# Patient Record
Sex: Male | Born: 1940 | Race: White | Hispanic: No | Marital: Married | Smoking: Current every day smoker
Health system: Southern US, Community
[De-identification: ages and names within clinical notes are randomized; demographics above are authoritative.]

## PROBLEM LIST (undated history)

## (undated) DIAGNOSIS — C787 Secondary malignant neoplasm of liver and intrahepatic bile duct: Secondary | ICD-10-CM

## (undated) DIAGNOSIS — F4310 Post-traumatic stress disorder, unspecified: Secondary | ICD-10-CM

## (undated) DIAGNOSIS — I4891 Unspecified atrial fibrillation: Secondary | ICD-10-CM

## (undated) DIAGNOSIS — I10 Essential (primary) hypertension: Secondary | ICD-10-CM

## (undated) DIAGNOSIS — A048 Other specified bacterial intestinal infections: Secondary | ICD-10-CM

## (undated) HISTORY — DX: Unspecified atrial fibrillation: I48.91

## (undated) HISTORY — DX: Essential (primary) hypertension: I10

## (undated) HISTORY — DX: Other specified bacterial intestinal infections: A04.8

## (undated) HISTORY — PX: OTHER SURGICAL HISTORY: SHX169

## (undated) HISTORY — PX: BLADDER SURGERY: SHX569

---
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
## 2004-06-22 DIAGNOSIS — F4310 Post-traumatic stress disorder, unspecified: Secondary | ICD-10-CM

## 2004-06-22 HISTORY — DX: Post-traumatic stress disorder, unspecified: F43.10

## 2018-11-30 ENCOUNTER — Other Ambulatory Visit: Payer: Self-pay | Admitting: Oncology

## 2019-03-14 ENCOUNTER — Other Ambulatory Visit: Payer: Self-pay

## 2019-04-05 ENCOUNTER — Telehealth: Payer: Self-pay

## 2019-04-05 NOTE — Telephone Encounter (Signed)
Received new referral from Pandora Leiter PA at [facility] for transition of care to [facility]. Patient is a 62YOM who was admitted to hospital on 11/29/18 for abdominal pain. CT scan of abdomen and pelvis revealed multiple liver masses. Liver BX on 11/28/18 which revealed adenocarcinoma of unknown primary. Biopsy was non-confirmatory. Decision was made to not repeat bx but to treat as a GI cancer and to start palliative Folfox. I spoke with patient and he states that he completed 3 cycles of chemo and then underwent Y90 procedure by Dr. Georga Hacking at [facility] week of 03/27/19 .  Patient is in transition to moving to [place] in the next few weeks but does not have a certain date. He reports "things are complicated." Our call was dropped. I have attempted to call back but line is busy. We may have to keep initial consult appointment open until he has a set date for moving to [place].  The referral packet did not include information from care related to Y90 including consult notes. I will ask our new patient scheduler to obtain these records.   I will touch base with patient tomorrow to see if he can give a projected move date.

## 2019-04-07 ENCOUNTER — Telehealth: Payer: Self-pay | Admitting: Oncology

## 2019-04-07 NOTE — Telephone Encounter (Signed)
Received a new patient referral from Dr. Sedonia Small in [place] to schedule Darius Ellis for an oncology appt. He's been cld and scheduled to see Dr. Benay Spice on 11/16 at 2pm. Pt hasn't moved as of yet and wanted to wait about a month before being seen. I updated the GI Navigator. Will also call to request radiology reports from Dr. Azucena Cecil office.

## 2019-04-17 ENCOUNTER — Telehealth: Payer: Self-pay

## 2019-04-17 NOTE — Telephone Encounter (Signed)
Called and spoke with patient's daughter. He has arrived to [place] from [place] earlier than anticipated and she reports that he is having increased symptoms. Patient moved from 11/16 to 11/3 for initial consult with Dr. Benay Spice. I requested that she reach out to [place] provider to obtain documentation from Y90 procedures. She will request that documents be faxed to [facility].

## 2019-04-19 ENCOUNTER — Telehealth: Payer: Self-pay | Admitting: Nurse Practitioner

## 2019-04-19 NOTE — Telephone Encounter (Signed)
Pt's daughter returned my call and confirmed appt w/Lisa on 10/30 at 2:15pm

## 2019-04-20 NOTE — Progress Notes (Addendum)
New Hematology/Oncology Consult   Requesting MD: Dr. Yevonne Aline  Reason for Consult: Metastatic adenocarcinoma of unknown primary  HPI: Darius Ellis is a 78 year old man with metastatic adenocarcinoma of unknown primary.  He initially presented in June of this year with abdominal pain.  He was found to have multiple liver lesions.  Biopsy of a liver lesion showed adenocarcinoma, unknown primary, most likely pancreatobiliary tree, upper GI tract.  He underwent a colonoscopy which was unremarkable except for mild internal hemorrhoids.  Upper endoscopy showed diffuse mild inflammation characterized by congestion and erythema in the gastric antrum.  Biopsy was obtained showing chronic active gastritis with intestinal metaplasia; immunohistochemical staining positive for H pylori.  He reports completing treatment for H. pylori.  Staging chest CT on 12/13/2018 showed resolution of previously noted small bilateral pleural effusions, trace pericardial effusion, interval development of several small lymph nodes in the right anterior cardiophrenic space and mildly prominent lymph nodes in the periceliac region, gastrohepatic ligament and periportal area.  PET scan on 12/28/2018 showed multiple intensely FDG avid hepatic metastases, FDG avid anterior cardiophrenic, periportal, periceliac and gastrohepatic ligament lymph nodes, new trace right pleural effusion and stable trace pericardial effusion.  He began treatment with FOLFOX on 01/10/2019.  He completed cycle 2 on 01/24/2019.  CTs 02/01/2019 showed multiple confluent liver lesions occupying nearly the entire left lobe.  More separated lesions in the right lobe increased in size with at least 10-12 enlarging/new lesions in the right lobe of the liver.  He completed cycle 3 FOLFOX 02/07/2019.  He underwent left hepatic Y90 on 03/03/2019 and right hepatic Y90 on 03/31/2019.   Past Medical History:  Diagnosis Date  . A-fib ([place])   . H. pylori infection    Treated and  healed  . Hypertension   . 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 PTSD (post-traumatic stress disorder) 2006  :   Past Surgical History:  Procedure Laterality Date  . BLADDER SURGERY     Stones  . Left Hip Replacement Left   . radioactive embolization     Liver  :   Current Outpatient Medications:  .  amLODipine-benazepril (LOTREL) 5-10 MG capsule, Take 1 capsule by mouth daily. 5-10 mg daily, Disp: , Rfl:  .  Ascorbic Acid (VITAMIN C) 1000 MG tablet, Take 1,000 mg by mouth daily., Disp: , Rfl:  .  Cholecalciferol (D3 VITAMIN PO), Take by mouth daily. 1000 units daily, Disp: , Rfl:  .  Cyanocobalamin (VITAMIN B 12 PO), Take by mouth daily. 1000 mcg at dinner, Disp: , Rfl:  .  gabapentin (NEURONTIN) 300 MG capsule, Take 300 mg by mouth 2 (two) times daily. 2 capsules 2 times daily, Disp: , Rfl:  .  GLUCOSAMINE-CHONDROITIN PO, Take 1 tablet by mouth 2 (two) times daily. Glucosamine 1500 mg/chondroitin 1200 mg, Disp: , Rfl:  .  Misc Natural Products (LUTEIN 20 PO), Take 1 tablet by mouth daily. 20 mg daily, Disp: , Rfl:  .  Multiple Vitamin (MULTIVITAMIN) tablet, Take 1 tablet by mouth daily., Disp: , Rfl:  .  ondansetron (ZOFRAN) 8 MG tablet, Take by mouth every 8 (eight) hours as needed for nausea or vomiting., Disp: , Rfl:  .  prochlorperazine (COMPAZINE) 10 MG tablet, Take 10 mg by mouth every 6 (six) hours as needed for nausea or vomiting., Disp: , Rfl:  .  topiramate (TOPAMAX) 25 MG tablet, Take 25 mg by mouth at bedtime. 3 tablets at bedtime, Disp: , Rfl: :  :  No Known Allergies:  FH: No family history of malignancy.  SOCIAL HISTORY: He recently relocated to [place] from [place].  He is living with his daughter.  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  Daily pipe.  Occasional glass of wine.  Review of Systems: He reports taking Aleve for abdominal pain, intermittently effective.  Daughter recently noted  abdominal distention.  She reports an ultrasound showed ascites.  There was not enough fluid to remove.  She has noticed pitting lower extremity edema.  He complains of early satiety.  Poor appetite.  No fever.  He has dyspnea which is worse with activity.  This has been ongoing for the past 6 to 8 weeks.  He has an occasional cough.  No fever.  No nausea or vomiting.  Periodic "dry heaves".  Stools are loose.  He does not characterize this as diarrhea.  He reports poor tolerance of FOLFOX chemotherapy with nausea and cold sensitivity.  Physical Exam:  Blood pressure (!) 170/99, pulse 96, temperature 98.3 F (36.8 C), temperature source Temporal, resp. rate 18, height 6\' 1"  (1.854 m), weight 189 lb 11.2 oz (86 kg), SpO2 100 %.  HEENT: Sclera anicteric.  Mild conjunctival pallor. Lungs: Breath sounds diminished at the left lung base.  No respiratory distress. Cardiac: Regular with occasional premature beat. Abdomen: Abdomen distended.  Liver palpable right upper abdomen crossing midline. Vascular: Pitting edema at the lower legs bilaterally left slightly greater than right.  Chronic stasis changes noted. Lymph nodes: No palpable cervical, supraclavicular, axillary or inguinal lymph nodes. Neurologic: Alert and oriented.  Follows commands. Port-A-Cath without erythema.  LABS: none  Assessment and Plan:   1. Metastatic adenocarcinoma unknown primary  CTs 11/28/2018-multiple liver masses  Biopsy liver lesion 11/30/2018 -adenocarcinoma unknown primary most likely pancreatobiliary tree (including intrahepatic cholangiocarcinoma) and upper GI tract; CK7 positive; CK20, TTF-1, CDX2, GATA 3, p63 and PSA negative  Colonoscopy 12/09/2018-internal hemorrhoids, mild.  Examination otherwise normal.  Upper endoscopy 12/09/2018-examined duodenum normal.  Diffuse mild inflammation characterized by congestion and erythema in the gastric antrum.  Biopsy showed chronic active gastritis with intestinal metaplasia;  immunohistochemical staining positive for H pylori.  CT chest 12/13/2018-heterogeneous hypodense nodule left thyroid lobe.  Previously reported small bilateral pleural effusions have resolved.  Trace pericardial effusion.  Interval development of several small lymph nodes in the right anterior cardiophrenic space measuring up to 1.2 x 0.8 cm.  Also mildly prominent lymph nodes in the periceliac region, gastrohepatic ligament and periportal area.  12/13/2018 CEA 8.2   12/28/2018 PET scan-multiple intensely FDG avid hepatic metastases.  FDG avid anterior cardiophrenic, periportal, periceliac and gastrohepatic ligament lymph nodes.  New trace right pleural effusion.  Stable trace pericardial effusion.  Cycle 1 FOLFOX 01/10/2019   Cycle 2 FOLFOX 01/24/2019   01/24/2019 CEA 13.3, CA 19-9 163  CTs 02/01/2019-multiple confluent liver lesions noted occupying nearly the entire left lobe.  More separated lesions in the right lobe have increased in size.  At least 10-12 enlarging/new lesions in the right lobe of the liver.  Cycle 3 FOLFOX 02/07/2019  03/03/2019-left hepatic Y90  03/31/2019-right hepatic Y90 2. BPH 3. Hearing loss 4. Neuropathy 5. History of H. pylori status post treatment 6. History of atrial fibrillation  Darius Ellis is a 78 year old man with metastatic adenocarcinoma of unknown primary.  He recently relocated to [facility] from [place] and is establishing care with medical oncology today.  He has completed 3 cycles of FOLFOX and underwent left hepatic Y90 on 03/03/2019,  right hepatic Y90 on 03/31/2019.  Per his report he did not tolerate FOLFOX well.  We are requesting the liver biopsy material from [place] as well as previous scans on a disc.  He will undergo restaging CTs here next week.  We will also obtain baseline lab studies to include CBC, chemistry panel, CEA and CA 19-9.  He is symptomatic from the cancer with abdominal pain.  We are sending a prescription to his pharmacy for  hydrocodone 5/325 1 to 2 tablets every 6 hours as needed.  He will return for a follow-up visit in 2 weeks.  Patient seen with Dr. Benay Spice.  60 minutes were spent face-to-face at today's visit with the majority of that time involved in counseling/coordination of care.    Ned Card, NP 04/21/2019, 3:44 PM   Darius Ellis was interviewed and examined.  We reviewed records from [place].  He has been diagnosed with metastatic adenocarcinoma of unknown primary.  He has completed treatment with 3 cycles of FOLFOX and liver radioembolization.  He continues to be symptomatic with pain, presumably related to liver metastases.  We discussed the prognosis and treatment options with Darius Ellis and his daughter.  He understands no therapy will be curative.  He will be referred for restaging CTs.  We will obtain the pathology block from the liver biopsy to see if there is enough tissue for NGS testing.  If not we will submit a peripheral blood sample for [employer] testing.  He will begin hydrocodone for pain.  Julieanne Manson, MD

## 2019-04-21 ENCOUNTER — Telehealth: Payer: Self-pay

## 2019-04-21 ENCOUNTER — Other Ambulatory Visit: Payer: Self-pay

## 2019-04-21 ENCOUNTER — Telehealth: Payer: Self-pay | Admitting: Nurse Practitioner

## 2019-04-21 ENCOUNTER — Inpatient Hospital Stay: Payer: Managed Care, Other (non HMO) | Attending: Nurse Practitioner | Admitting: Nurse Practitioner

## 2019-04-21 ENCOUNTER — Encounter: Payer: Self-pay | Admitting: Nurse Practitioner

## 2019-04-21 VITALS — BP 170/99 | HR 96 | Temp 98.3°F | Resp 18 | Ht 73.0 in | Wt 189.7 lb

## 2019-04-21 DIAGNOSIS — C801 Malignant (primary) neoplasm, unspecified: Secondary | ICD-10-CM | POA: Insufficient documentation

## 2019-04-21 DIAGNOSIS — C787 Secondary malignant neoplasm of liver and intrahepatic bile duct: Secondary | ICD-10-CM | POA: Insufficient documentation

## 2019-04-21 DIAGNOSIS — C799 Secondary malignant neoplasm of unspecified site: Secondary | ICD-10-CM

## 2019-04-21 MED ORDER — HYDROCODONE-ACETAMINOPHEN 5-325 MG PO TABS: 1.0000 | ORAL_TABLET | Freq: Four times a day (QID) | ORAL | 0 refills | Status: DC | PRN

## 2019-04-21 NOTE — Progress Notes (Signed)
Met with Darius Ellis and his daughter during initial consult with Lisa/Dr. Benay Spice. We had established contact while he was living in [place]. He is living with his daughter now and is appreciative of the support. I provided them with [facility] resources,list of  virtual support groups and contact information for treatment team members. Will reach out to daughter to ler her know about the support group, [facility], for patient's and caregivers.

## 2019-04-21 NOTE — Telephone Encounter (Signed)
Scheduled per los. Gave avs and calendar. Sent referral

## 2019-04-21 NOTE — Telephone Encounter (Signed)
LPN reached out to [facility] and left a voice mail.  Inquiring about having slides from patient biopsy sent from their pathology dept. To the cancer center.  Patient info left with center as well as cancer center phone number for call back.

## 2019-04-24 ENCOUNTER — Inpatient Hospital Stay: Payer: Managed Care, Other (non HMO) | Attending: Nurse Practitioner

## 2019-04-24 ENCOUNTER — Other Ambulatory Visit: Payer: Self-pay

## 2019-04-24 DIAGNOSIS — J90 Pleural effusion, not elsewhere classified: Secondary | ICD-10-CM | POA: Diagnosis not present

## 2019-04-24 DIAGNOSIS — I4891 Unspecified atrial fibrillation: Secondary | ICD-10-CM | POA: Insufficient documentation

## 2019-04-24 DIAGNOSIS — R188 Other ascites: Secondary | ICD-10-CM | POA: Insufficient documentation

## 2019-04-24 DIAGNOSIS — C787 Secondary malignant neoplasm of liver and intrahepatic bile duct: Secondary | ICD-10-CM | POA: Insufficient documentation

## 2019-04-24 DIAGNOSIS — C801 Malignant (primary) neoplasm, unspecified: Secondary | ICD-10-CM | POA: Insufficient documentation

## 2019-04-24 DIAGNOSIS — N400 Enlarged prostate without lower urinary tract symptoms: Secondary | ICD-10-CM | POA: Diagnosis not present

## 2019-04-24 DIAGNOSIS — G629 Polyneuropathy, unspecified: Secondary | ICD-10-CM | POA: Diagnosis not present

## 2019-04-24 DIAGNOSIS — Z5111 Encounter for antineoplastic chemotherapy: Secondary | ICD-10-CM | POA: Insufficient documentation

## 2019-04-24 DIAGNOSIS — C799 Secondary malignant neoplasm of unspecified site: Secondary | ICD-10-CM

## 2019-04-24 LAB — CBC WITH DIFFERENTIAL (CANCER CENTER ONLY)
Abs Immature Granulocytes: 0.27 10*3/uL — ABNORMAL HIGH (ref 0.00–0.07)
Basophils Absolute: 0.1 10*3/uL (ref 0.0–0.1)
Basophils Relative: 1 %
Eosinophils Absolute: 0.2 10*3/uL (ref 0.0–0.5)
Eosinophils Relative: 1 %
HCT: 41.5 % (ref 39.0–52.0)
Hemoglobin: 13.1 g/dL (ref 13.0–17.0)
Immature Granulocytes: 2 %
Lymphocytes Relative: 7 %
Lymphs Abs: 0.9 10*3/uL (ref 0.7–4.0)
MCH: 29.9 pg (ref 26.0–34.0)
MCHC: 31.6 g/dL (ref 30.0–36.0)
MCV: 94.7 fL (ref 80.0–100.0)
Monocytes Absolute: 1.2 10*3/uL — ABNORMAL HIGH (ref 0.1–1.0)
Monocytes Relative: 10 %
Neutro Abs: 9.4 10*3/uL — ABNORMAL HIGH (ref 1.7–7.7)
Neutrophils Relative %: 79 %
Platelet Count: 216 10*3/uL (ref 150–400)
RBC: 4.38 MIL/uL (ref 4.22–5.81)
RDW: 14.6 % (ref 11.5–15.5)
WBC Count: 11.9 10*3/uL — ABNORMAL HIGH (ref 4.0–10.5)
nRBC: 0 % (ref 0.0–0.2)

## 2019-04-24 LAB — URINALYSIS, COMPLETE (UACMP) WITH MICROSCOPIC
Bacteria, UA: NONE SEEN
Bilirubin Urine: NEGATIVE
Glucose, UA: NEGATIVE mg/dL
Hgb urine dipstick: NEGATIVE
Ketones, ur: NEGATIVE mg/dL
Leukocytes,Ua: NEGATIVE
Nitrite: NEGATIVE
Protein, ur: NEGATIVE mg/dL
Specific Gravity, Urine: 1.024 (ref 1.005–1.030)
pH: 5 (ref 5.0–8.0)

## 2019-04-24 LAB — CMP (CANCER CENTER ONLY)
ALT: 14 U/L (ref 0–44)
AST: 31 U/L (ref 15–41)
Albumin: 2.8 g/dL — ABNORMAL LOW (ref 3.5–5.0)
Alkaline Phosphatase: 238 U/L — ABNORMAL HIGH (ref 38–126)
Anion gap: 11 (ref 5–15)
BUN: 25 mg/dL — ABNORMAL HIGH (ref 8–23)
CO2: 23 mmol/L (ref 22–32)
Calcium: 8.9 mg/dL (ref 8.9–10.3)
Chloride: 107 mmol/L (ref 98–111)
Creatinine: 1.29 mg/dL — ABNORMAL HIGH (ref 0.61–1.24)
GFR, Est AFR Am: >60 mL/min (ref >60)
GFR, Estimated: 52 mL/min — ABNORMAL LOW (ref >60)
Glucose, Bld: 103 mg/dL — ABNORMAL HIGH (ref 70–99)
Potassium: 4 mmol/L (ref 3.5–5.1)
Sodium: 141 mmol/L (ref 135–145)
Total Bilirubin: 1 mg/dL (ref 0.3–1.2)
Total Protein: 6.3 g/dL — ABNORMAL LOW (ref 6.5–8.1)

## 2019-04-24 LAB — CEA (IN HOUSE-CHCC): CEA (CHCC-In House): 25.34 ng/mL — ABNORMAL HIGH (ref 0.00–5.00)

## 2019-04-25 ENCOUNTER — Ambulatory Visit: Payer: Self-pay | Admitting: Nurse Practitioner

## 2019-04-25 LAB — CANCER ANTIGEN 19-9: CA 19-9: 131 U/mL — ABNORMAL HIGH (ref 0–35)

## 2019-04-27 ENCOUNTER — Other Ambulatory Visit: Payer: Self-pay | Admitting: Nurse Practitioner

## 2019-04-27 DIAGNOSIS — C787 Secondary malignant neoplasm of liver and intrahepatic bile duct: Secondary | ICD-10-CM

## 2019-04-27 DIAGNOSIS — C799 Secondary malignant neoplasm of unspecified site: Secondary | ICD-10-CM

## 2019-04-27 DIAGNOSIS — C801 Malignant (primary) neoplasm, unspecified: Secondary | ICD-10-CM

## 2019-05-01 ENCOUNTER — Ambulatory Visit (INDEPENDENT_AMBULATORY_CARE_PROVIDER_SITE_OTHER): Payer: Managed Care, Other (non HMO) | Admitting: Family Medicine

## 2019-05-01 ENCOUNTER — Encounter: Payer: Self-pay | Admitting: Family Medicine

## 2019-05-01 ENCOUNTER — Other Ambulatory Visit: Payer: Self-pay

## 2019-05-01 VITALS — BP 130/76 | HR 100 | Ht 73.0 in | Wt 195.0 lb

## 2019-05-01 DIAGNOSIS — R0600 Dyspnea, unspecified: Secondary | ICD-10-CM

## 2019-05-01 DIAGNOSIS — I499 Cardiac arrhythmia, unspecified: Secondary | ICD-10-CM | POA: Diagnosis not present

## 2019-05-01 DIAGNOSIS — R0609 Other forms of dyspnea: Secondary | ICD-10-CM

## 2019-05-01 DIAGNOSIS — R0981 Nasal congestion: Secondary | ICD-10-CM

## 2019-05-01 MED ORDER — PREDNISONE 20 MG PO TABS: 20.0000 mg | ORAL_TABLET | Freq: Two times a day (BID) | ORAL | 0 refills | Status: AC

## 2019-05-01 MED ORDER — FLUTICASONE PROPIONATE 50 MCG/ACT NA SUSP: 2.0000 | Freq: Every day | NASAL | 6 refills | Status: AC

## 2019-05-01 NOTE — Progress Notes (Addendum)
Established Patient Office Visit  Subjective:  Patient ID: Darius Ellis, male    DOB: 1941-05-27  Age: 78 y.o. MRN: [id number]  CC:  Chief Complaint  Patient presents with  . Establish Care    HPI Darius Ellis presents for establishment of care.  Accompanied by his daughter who is a Marine scientist at the [facility] wound care center.  Patient recently moved up here from [facility].  Diagnosed earlier this year with metastatic adenocarcinoma of his liver without a known primary.  Recent follow-up with oncology.  Pending CT scans of his abdomen and pelvis have been ordered.  He has been having problems with nasal and sinus congestion over the last several months.  He does not have a history of sinus disease or allergy rhinitis.  Physician had suggested Claritin which has not helped.  He has not been using nasal decongestants.  Denies fevers rhinorrhea or significant postnasal drip.  Continues to smoke a pipe 50 twice daily.  Significant past medical history of hypertension, paroxysmal atrial fibrillation BPH (taking finasteride and Flomax) neuropathy.  He has ongoing issues with some abdominal pain and loose stool.  Rarely has been taking his narcotic pain medicine.  He has developed shortness of breath and dyspnea with exertion.  There has been no chest pain.  Past Medical History:  Diagnosis Date  . A-fib ([place])   . H. pylori infection    Treated and healed  . Hypertension   . 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 PTSD (post-traumatic stress disorder) 2006    Past Surgical History:  Procedure Laterality Date  . BLADDER SURGERY     Stones  . Left Hip Replacement Left   . radioactive embolization     Liver    Family History  Problem Relation Age of Onset  . Polycystic kidney disease Mother     Social History   Socioeconomic History  . Marital status: Married    Spouse name: Not on file  . Number of children: Not on file  . Years of  education: Not on file  . Highest education level: Not on file  Occupational History  . Not on file  Social Needs  . Financial resource strain: Not on file  . Food insecurity    Worry: Not on file    Inability: Not on file  . Transportation needs    Medical: Not on file    Non-medical: Not on file  Tobacco Use  . Smoking status: Current Every Day Smoker    Types: Pipe  . Smokeless tobacco: Current User  Substance and Sexual Activity  . Alcohol use: Not Currently  . Drug use: Never  . Sexual activity: Not on file  Lifestyle  . Physical activity    Days per week: Not on file    Minutes per session: Not on file  . Stress: Not on file  Relationships  . Social Herbalist on phone: Not on file    Gets together: Not on file    Attends religious service: Not on file    Active member of club or organization: Not on file    Attends meetings of clubs or organizations: Not on file    Relationship status: Not on file  . Intimate partner violence    Fear of current or ex partner: Not on file    Emotionally abused: Not on file    Physically abused: Not on file    Forced  sexual activity: Not on file  Other Topics Concern  . Not on file  Social History Narrative  . Not on file    Outpatient Medications Prior to Visit  Medication Sig Dispense Refill  . amLODipine-benazepril (LOTREL) 5-10 MG capsule Take 1 capsule by mouth daily. 5-10 mg daily    . Ascorbic Acid (VITAMIN C) 1000 MG tablet Take 1,000 mg by mouth daily.    . Cholecalciferol (D3 VITAMIN PO) Take by mouth daily. 1000 units daily    . Cyanocobalamin (VITAMIN B 12 PO) Take by mouth daily. 1000 mcg at dinner    . gabapentin (NEURONTIN) 300 MG capsule Take 300 mg by mouth 2 (two) times daily. 2 capsules 2 times daily    . GLUCOSAMINE-CHONDROITIN PO Take 1 tablet by mouth 2 (two) times daily. Glucosamine 1500 mg/chondroitin 1200 mg    . HYDROcodone-acetaminophen (NORCO) 5-325 MG tablet Take 1-2 tablets by mouth  every 6 (six) hours as needed for moderate pain. 40 tablet 0  . Misc Natural Products (LUTEIN 20 PO) Take 1 tablet by mouth daily. 20 mg daily    . Multiple Vitamin (MULTIVITAMIN) tablet Take 1 tablet by mouth daily.    . ondansetron (ZOFRAN) 8 MG tablet Take by mouth every 8 (eight) hours as needed for nausea or vomiting.    . prochlorperazine (COMPAZINE) 10 MG tablet Take 10 mg by mouth every 6 (six) hours as needed for nausea or vomiting.    . topiramate (TOPAMAX) 25 MG tablet Take 25 mg by mouth at bedtime. 3 tablets at bedtime     No facility-administered medications prior to visit.     No Known Allergies  ROS Review of Systems  Constitutional: Negative for diaphoresis, fatigue, fever and unexpected weight change.  HENT: Positive for hearing loss and sinus pressure. Negative for postnasal drip, rhinorrhea and sneezing.   Eyes: Negative for photophobia and visual disturbance.  Respiratory: Positive for shortness of breath. Negative for chest tightness and wheezing.   Cardiovascular: Positive for leg swelling. Negative for chest pain and palpitations.  Gastrointestinal: Positive for abdominal pain and nausea. Negative for abdominal distention, constipation and vomiting.  Endocrine: Negative for polyphagia and polyuria.  Genitourinary: Negative for difficulty urinating, frequency and urgency.  Musculoskeletal: Negative for back pain and gait problem.  Skin: Negative for pallor and rash.  Allergic/Immunologic: Negative for immunocompromised state.  Neurological: Negative for light-headedness and numbness.  Hematological: Does not bruise/bleed easily.  Psychiatric/Behavioral: Negative.       Objective:    Physical Exam  Constitutional: He is oriented to person, place, and time. He appears well-developed and well-nourished. No distress.  HENT:  Head: Normocephalic and atraumatic.  Right Ear: External ear normal.  Left Ear: External ear normal.  Mouth/Throat: Oropharynx is clear  and moist. No oropharyngeal exudate.  Eyes: Pupils are equal, round, and reactive to light. Conjunctivae are normal. Right eye exhibits no discharge. Left eye exhibits no discharge. No scleral icterus.  Neck: No JVD present. No tracheal deviation present. No thyromegaly present.  Cardiovascular: Normal rate, regular rhythm and normal heart sounds.  Dropped beats   Pulmonary/Chest: Effort normal. No stridor. He has decreased breath sounds. He has no wheezes. He has no rhonchi. He has no rales.  Abdominal: Bowel sounds are normal.  Musculoskeletal:        General: Edema present.  Lymphadenopathy:    He has no cervical adenopathy.  Neurological: He is alert and oriented to person, place, and time.  Skin: Skin is warm  and dry. He is not diaphoretic.  Psychiatric: He has a normal mood and affect. His behavior is normal.    BP 130/76   Pulse 100   Ht 6\' 1"  (1.854 m)   Wt 195 lb (88.5 kg)   SpO2 98%   BMI 25.73 kg/m  Wt Readings from Last 3 Encounters:  05/01/19 195 lb (88.5 kg)  04/21/19 189 lb 11.2 oz (86 kg)   BP Readings from Last 3 Encounters:  05/01/19 130/76  04/21/19 (!) 170/99   Guideline developer:  UpToDate (see UpToDate for funding source) Date Released: June 2014  Health Maintenance Due  Topic Date Due  . Samul Dada  12/[birth year]/1958  . PNA vac Low Risk Adult (1 of 2 - PCV13) 12/30/2004  . INFLUENZA VACCINE  01/21/2019    There are no preventive care reminders to display for this patient.  No results found for: TSH Lab Results  Component Value Date   WBC 11.9 (H) 04/24/2019   HGB 13.1 04/24/2019   HCT 41.5 04/24/2019   MCV 94.7 04/24/2019   PLT 216 04/24/2019   Lab Results  Component Value Date   NA 141 04/24/2019   K 4.0 04/24/2019   CO2 23 04/24/2019   GLUCOSE 103 (H) 04/24/2019   BUN 25 (H) 04/24/2019   CREATININE 1.29 (H) 04/24/2019   BILITOT 1.0 04/24/2019   ALKPHOS 238 (H) 04/24/2019   AST 31 04/24/2019   ALT 14 04/24/2019   PROT 6.3 (L)  04/24/2019   ALBUMIN 2.8 (L) 04/24/2019   CALCIUM 8.9 04/24/2019   ANIONGAP 11 04/24/2019   No results found for: CHOL No results found for: HDL No results found for: LDLCALC No results found for: TRIG No results found for: CHOLHDL No results found for: HGBA1C    Assessment & Plan:   Problem List Items Addressed This Visit    None    Visit Diagnoses    Irregular heartbeat    -  Primary   Relevant Orders   EKG 12-Lead (Completed)   DOE (dyspnea on exertion)       Relevant Medications   predniSONE (DELTASONE) 20 MG tablet   Sinus congestion       Relevant Medications   predniSONE (DELTASONE) 20 MG tablet   fluticasone (FLONASE) 50 MCG/ACT nasal spray      Meds ordered this encounter  Medications  . predniSONE (DELTASONE) 20 MG tablet    Sig: Take 1 tablet (20 mg total) by mouth 2 (two) times daily with a meal for 7 days.    Dispense:  14 tablet    Refill:  0  . fluticasone (FLONASE) 50 MCG/ACT nasal spray    Sig: Place 2 sprays into both nostrils daily.    Dispense:  16 g    Refill:  6    Follow-up: Return in about 2 weeks (around 05/15/2019).   Trial of Flonase and prednisone.  Follow-up in 2 weeks for recheck.

## 2019-05-02 ENCOUNTER — Ambulatory Visit (HOSPITAL_COMMUNITY)
Admission: RE | Admit: 2019-05-02 | Discharge: 2019-05-02 | Disposition: A | Discharge status: Home / Self Care | Payer: Managed Care, Other (non HMO) | Source: Ambulatory Visit | Attending: Nurse Practitioner | Admitting: Nurse Practitioner

## 2019-05-02 ENCOUNTER — Ambulatory Visit
Admission: RE | Admit: 2019-05-02 | Discharge: 2019-05-02 | Disposition: A | Discharge status: Home / Self Care | Payer: Self-pay | Source: Ambulatory Visit | Attending: Oncology | Admitting: Oncology

## 2019-05-02 ENCOUNTER — Other Ambulatory Visit: Payer: Self-pay | Admitting: *Deleted

## 2019-05-02 DIAGNOSIS — C787 Secondary malignant neoplasm of liver and intrahepatic bile duct: Secondary | ICD-10-CM

## 2019-05-02 DIAGNOSIS — C801 Malignant (primary) neoplasm, unspecified: Secondary | ICD-10-CM | POA: Diagnosis present

## 2019-05-02 MED ORDER — SODIUM CHLORIDE (PF) 0.9 % IJ SOLN
INTRAMUSCULAR | Status: AC
  Filled 2019-05-02: qty 50

## 2019-05-02 MED ORDER — IOHEXOL 300 MG/ML  SOLN
100.0000 mL | Freq: Once | INTRAMUSCULAR | Status: AC | PRN
  Administered 2019-05-02: 17:00:00 100 mL via INTRAVENOUS

## 2019-05-02 NOTE — Progress Notes (Signed)
Received CT report and CD of abd/pelvis CT from [facility]. CD taken to radiology to load.

## 2019-05-03 ENCOUNTER — Other Ambulatory Visit (HOSPITAL_COMMUNITY): Payer: Self-pay | Admitting: Oncology

## 2019-05-03 ENCOUNTER — Inpatient Hospital Stay
Admission: RE | Admit: 2019-05-03 | Discharge: 2019-05-03 | Disposition: A | Discharge status: Home / Self Care | Payer: Self-pay | Source: Ambulatory Visit | Attending: Oncology | Admitting: Oncology

## 2019-05-03 DIAGNOSIS — C801 Malignant (primary) neoplasm, unspecified: Secondary | ICD-10-CM

## 2019-05-03 NOTE — Telephone Encounter (Signed)
Opened in error

## 2019-05-05 ENCOUNTER — Encounter: Payer: Self-pay | Admitting: Nurse Practitioner

## 2019-05-05 ENCOUNTER — Ambulatory Visit (HOSPITAL_COMMUNITY)
Admission: RE | Admit: 2019-05-05 | Discharge: 2019-05-05 | Disposition: A | Discharge status: Home / Self Care | Payer: Medicare Other | Source: Ambulatory Visit | Attending: Nurse Practitioner | Admitting: Nurse Practitioner

## 2019-05-05 ENCOUNTER — Inpatient Hospital Stay: Payer: Managed Care, Other (non HMO) | Admitting: Nurse Practitioner

## 2019-05-05 ENCOUNTER — Other Ambulatory Visit: Payer: Self-pay

## 2019-05-05 VITALS — BP 125/83 | HR 97 | Temp 97.9°F | Resp 18 | Ht 73.0 in | Wt 196.8 lb

## 2019-05-05 DIAGNOSIS — C801 Malignant (primary) neoplasm, unspecified: Secondary | ICD-10-CM | POA: Diagnosis present

## 2019-05-05 DIAGNOSIS — C787 Secondary malignant neoplasm of liver and intrahepatic bile duct: Secondary | ICD-10-CM

## 2019-05-05 DIAGNOSIS — Z7189 Other specified counseling: Secondary | ICD-10-CM | POA: Diagnosis not present

## 2019-05-05 DIAGNOSIS — Z5111 Encounter for antineoplastic chemotherapy: Secondary | ICD-10-CM | POA: Diagnosis not present

## 2019-05-05 HISTORY — DX: Secondary malignant neoplasm of liver and intrahepatic bile duct: C78.7

## 2019-05-05 HISTORY — DX: Secondary malignant neoplasm of liver and intrahepatic bile duct: C80.1

## 2019-05-05 LAB — SURGICAL PATHOLOGY

## 2019-05-05 MED ORDER — LIDOCAINE HCL 1 % IJ SOLN
INTRAMUSCULAR | Status: AC
  Filled 2019-05-05: qty 20

## 2019-05-05 NOTE — Procedures (Signed)
Ultrasound-guided diagnostic and therapeutic paracentesis performed yielding 5 liters (maximum ordered)of yellow fluid. No immediate complications. A portion of the fluid was sent to the lab for preordered studies. EBL none.   

## 2019-05-05 NOTE — Progress Notes (Addendum)
[facility] OFFICE PROGRESS NOTE   Diagnosis: Metastatic adenocarcinoma unknown primary  INTERVAL HISTORY:   Darius Ellis returns as scheduled.  He reports increased shortness of breath.  No cough or fever.  He denies significant pain.  He has occasional left-sided abdominal pain.  Bowels are moving.  He has diarrhea at times.  Appetite is poor.  Objective:  Vital signs in last 24 hours:  Blood pressure 125/83, pulse 97, temperature 97.9 F (36.6 C), temperature source Temporal, resp. rate 18, height 6\' 1"  (1.854 m), weight 196 lb 12.8 oz (89.3 kg), SpO2 100 %.    Resp: Breath sounds diminished at the bases bilaterally.  No respiratory distress. Cardio: Regular rate and rhythm. GI: Abdomen is distended.  Prominent vein pattern over abdominal wall. Vascular: Pitting edema at the lower legs bilaterally left greater than right.  Chronic stasis changes. Neuro: Alert and oriented.  Port-A-Cath without erythema.  Lab Results:  Lab Results  Component Value Date   WBC 11.9 (H) 04/24/2019   HGB 13.1 04/24/2019   HCT 41.5 04/24/2019   MCV 94.7 04/24/2019   PLT 216 04/24/2019   NEUTROABS 9.4 (H) 04/24/2019    Imaging:  No results found.  Medications: I have reviewed the patient's current medications.  Assessment/Plan: 1. Metastatic adenocarcinoma unknown primary  CTs 11/28/2018-large mass left hepatic lobe measuring 9.1 cm, few ill-defined lesions in the right hepatic lobe measuring up to 9 mm.  Indeterminate gastrohepatic ligament lymph node measuring 1.1 cm.  Shotty periportal and right pericardial adenopathy.  Biopsy liver lesion 11/30/2018 -adenocarcinoma unknown primary most likely pancreatobiliary tree (including intrahepatic cholangiocarcinoma) and upper GI tract; CK7 positive; CK20, TTF-1, CDX2, GATA 3, p63 and PSA negative  Colonoscopy 12/09/2018-internal hemorrhoids, mild.  Examination otherwise normal.  Upper endoscopy 12/09/2018-examined duodenum  normal.  Diffuse mild inflammation characterized by congestion and erythema in the gastric antrum.  Biopsy showed chronic active gastritis with intestinal metaplasia; immunohistochemical staining positive for H pylori.  CT chest 12/13/2018-heterogeneous hypodense nodule left thyroid lobe.  Previously reported small bilateral pleural effusions have resolved.  Trace pericardial effusion.  Interval development of several small lymph nodes in the right anterior cardiophrenic space measuring up to 1.2 x 0.8 cm.  Also mildly prominent lymph nodes in the periceliac region, gastrohepatic ligament and periportal area.  12/13/2018 CEA 8.2   12/28/2018 PET scan-multiple intensely FDG avid hepatic metastases.  FDG avid anterior cardiophrenic, periportal, periceliac and gastrohepatic ligament lymph nodes.  New trace right pleural effusion.  Stable trace pericardial effusion.  Cycle 1 FOLFOX 01/10/2019   Cycle 2 FOLFOX 01/24/2019   01/24/2019 CEA 13.3, CA 19-9 163  CTs 02/01/2019-multiple confluent liver lesions noted occupying nearly the entire left lobe.  More separated lesions in the right lobe have increased in size.  At least 10-12 enlarging/new lesions in the right lobe of the liver.  Cycle 3 FOLFOX 02/07/2019  03/03/2019-left hepatic Y90  03/31/2019-right hepatic Y90  CTs 05/02/2019-liver lesions have significantly increased in number; others are stable to increased in size since 02/01/2019 CT.  Enlarged gastrohepatic ligament, portacaval, left periaortic and aortocaval lymph nodes appear stable.  Anasarca, ascites and pleural effusions are essentially new. 2. BPH 3. Hearing loss 4. Neuropathy 5. History of H. pylori status post treatment 6. History of atrial fibrillation  Disposition: Darius Ellis appears unchanged.  He continues to have a poor performance status.  The recent restaging CTs show new liver lesions as well as new ascites and pleural effusions.  Other liver lesions are  stable to increased.  We  reviewed the CT report and images with Darius Ellis and his daughter.  They understand findings are consistent with progression of the cancer.  We discussed options to include a trial of chemotherapy versus supportive care.  He is interested in chemotherapy.  Dr. Benay Spice recommends gemcitabine/Abraxane on a 2-week schedule.  We reviewed potential toxicities including bone marrow toxicity, nausea, hair loss, allergic reaction.  We discussed the possibility of fever and rash with gemcitabine.  We discussed neuropathy associated with Abraxane.  He agrees to proceed.  He will attend a chemotherapy education class with the plan for cycle 1 gemcitabine/Abraxane in 1 week.  We discussed submitting peripheral blood for guardant 360 testing.  He would like to proceed with this.    He is uncomfortable from abdominal distention/ascites.  We are referring him for a diagnostic/therapeutic paracentesis.  He will return for lab, follow-up, cycle 1 gemcitabine/Abraxane in 1 week.  He will contact the office in the interim with any problems.  Patient seen with Dr. Benay Spice.  25 minutes were spent face-to-face at today's visit with the majority of that time involved in counseling/coordination of care.    Ned Card ANP/GNP-BC   05/05/2019  9:37 AM  This was a shared visit with Ned Card.  We reviewed the CT images with Darius Ellis and his daughter.  There is clinical and radiologic evidence of progression.  He is symptomatic with large volume ascites.  He will be referred for a therapeutic paracentesis.  Darius Ellis understands no therapy will be curative.  We discussed the poor prognosis and treatment options.  We discussed comfort care versus a trial of second line systemic therapy.  He would like to proceed with additional therapy.  I recommend gemcitabine/Abraxane.  We reviewed potential toxicities associated with this regimen.  He agrees to proceed.  We will submit tumor from the June 2020 liver biopsy or  peripheral blood for NGS testing.  Julieanne Manson, MD

## 2019-05-05 NOTE — Progress Notes (Signed)
START OFF PATHWAY REGIMEN - Other   [id number]:Nab-Paclitaxel (Abraxane) 125 mg/m2 D1, 8, 15 + Gemcitabine 1,000 mg/m2 D1, 8, 15 q28 Days:   A cycle is every 28 days:     Nab-paclitaxel (protein bound)      Gemcitabine   **Always confirm dose/schedule in your pharmacy ordering system**  Patient Characteristics: Intent of Therapy: Non-Curative / Palliative Intent, Discussed with Patient 

## 2019-05-08 ENCOUNTER — Ambulatory Visit: Payer: Self-pay | Admitting: Oncology

## 2019-05-08 ENCOUNTER — Telehealth: Payer: Self-pay | Admitting: Oncology

## 2019-05-08 LAB — CYTOLOGY - NON PAP

## 2019-05-08 NOTE — Telephone Encounter (Signed)
Scheduled per los. Called and left msg  ° °

## 2019-05-10 ENCOUNTER — Other Ambulatory Visit: Payer: Self-pay | Admitting: *Deleted

## 2019-05-10 ENCOUNTER — Other Ambulatory Visit: Payer: Self-pay

## 2019-05-10 ENCOUNTER — Inpatient Hospital Stay: Payer: Managed Care, Other (non HMO)

## 2019-05-10 DIAGNOSIS — C787 Secondary malignant neoplasm of liver and intrahepatic bile duct: Secondary | ICD-10-CM

## 2019-05-10 DIAGNOSIS — C801 Malignant (primary) neoplasm, unspecified: Secondary | ICD-10-CM

## 2019-05-12 ENCOUNTER — Inpatient Hospital Stay: Payer: Managed Care, Other (non HMO)

## 2019-05-12 ENCOUNTER — Other Ambulatory Visit: Payer: Self-pay

## 2019-05-12 ENCOUNTER — Encounter: Payer: Self-pay | Admitting: Nurse Practitioner

## 2019-05-12 ENCOUNTER — Encounter: Payer: Self-pay | Admitting: Oncology

## 2019-05-12 ENCOUNTER — Inpatient Hospital Stay (HOSPITAL_BASED_OUTPATIENT_CLINIC_OR_DEPARTMENT_OTHER): Payer: Managed Care, Other (non HMO) | Admitting: Nurse Practitioner

## 2019-05-12 ENCOUNTER — Ambulatory Visit (HOSPITAL_COMMUNITY)
Admission: RE | Admit: 2019-05-12 | Discharge: 2019-05-12 | Disposition: A | Discharge status: Home / Self Care | Payer: Managed Care, Other (non HMO) | Source: Ambulatory Visit | Attending: Nurse Practitioner | Admitting: Nurse Practitioner

## 2019-05-12 VITALS — BP 107/66 | HR 77 | Temp 98.2°F | Resp 17 | Ht 73.0 in | Wt 187.6 lb

## 2019-05-12 DIAGNOSIS — C801 Malignant (primary) neoplasm, unspecified: Secondary | ICD-10-CM

## 2019-05-12 DIAGNOSIS — C787 Secondary malignant neoplasm of liver and intrahepatic bile duct: Secondary | ICD-10-CM | POA: Insufficient documentation

## 2019-05-12 LAB — CBC WITH DIFFERENTIAL (CANCER CENTER ONLY)
Abs Immature Granulocytes: 0.5 10*3/uL — ABNORMAL HIGH (ref 0.00–0.07)
Basophils Absolute: 0.1 10*3/uL (ref 0.0–0.1)
Basophils Relative: 0 %
Eosinophils Absolute: 0.2 10*3/uL (ref 0.0–0.5)
Eosinophils Relative: 1 %
HCT: 45.3 % (ref 39.0–52.0)
Hemoglobin: 14.1 g/dL (ref 13.0–17.0)
Immature Granulocytes: 3 %
Lymphocytes Relative: 4 %
Lymphs Abs: 0.6 10*3/uL — ABNORMAL LOW (ref 0.7–4.0)
MCH: 29 pg (ref 26.0–34.0)
MCHC: 31.1 g/dL (ref 30.0–36.0)
MCV: 93.2 fL (ref 80.0–100.0)
Monocytes Absolute: 1 10*3/uL (ref 0.1–1.0)
Monocytes Relative: 6 %
Neutro Abs: 13.5 10*3/uL — ABNORMAL HIGH (ref 1.7–7.7)
Neutrophils Relative %: 86 %
Platelet Count: 167 10*3/uL (ref 150–400)
RBC: 4.86 MIL/uL (ref 4.22–5.81)
RDW: 15.3 % (ref 11.5–15.5)
WBC Count: 15.9 10*3/uL — ABNORMAL HIGH (ref 4.0–10.5)
nRBC: 0 % (ref 0.0–0.2)

## 2019-05-12 LAB — CMP (CANCER CENTER ONLY)
ALT: 14 U/L (ref 0–44)
AST: 24 U/L (ref 15–41)
Albumin: 2.8 g/dL — ABNORMAL LOW (ref 3.5–5.0)
Alkaline Phosphatase: 262 U/L — ABNORMAL HIGH (ref 38–126)
Anion gap: 11 (ref 5–15)
BUN: 35 mg/dL — ABNORMAL HIGH (ref 8–23)
CO2: 23 mmol/L (ref 22–32)
Calcium: 8.6 mg/dL — ABNORMAL LOW (ref 8.9–10.3)
Chloride: 106 mmol/L (ref 98–111)
Creatinine: 1.45 mg/dL — ABNORMAL HIGH (ref 0.61–1.24)
GFR, Est AFR Am: 53 mL/min — ABNORMAL LOW (ref >60)
GFR, Estimated: 45 mL/min — ABNORMAL LOW (ref >60)
Glucose, Bld: 99 mg/dL (ref 70–99)
Potassium: 4.3 mmol/L (ref 3.5–5.1)
Sodium: 140 mmol/L (ref 135–145)
Total Bilirubin: 1 mg/dL (ref 0.3–1.2)
Total Protein: 6 g/dL — ABNORMAL LOW (ref 6.5–8.1)

## 2019-05-12 MED ORDER — SODIUM CHLORIDE 0.9 % IV SOLN
Freq: Once | INTRAVENOUS | Status: AC
  Administered 2019-05-12: 11:00:00 via INTRAVENOUS
  Filled 2019-05-12: qty 250

## 2019-05-12 MED ORDER — SODIUM CHLORIDE 0.9 % IV SOLN
800.0000 mg/m2 | Freq: Once | INTRAVENOUS | Status: AC
  Administered 2019-05-12: 1672 mg via INTRAVENOUS
  Filled 2019-05-12: qty 43.97

## 2019-05-12 MED ORDER — PROCHLORPERAZINE MALEATE 10 MG PO TABS
ORAL_TABLET | ORAL | Status: AC
  Filled 2019-05-12: qty 1

## 2019-05-12 MED ORDER — APIXABAN (ELIQUIS) VTE STARTER PACK (10MG AND 5MG): ORAL_TABLET | ORAL | 0 refills | Status: DC

## 2019-05-12 MED ORDER — PACLITAXEL PROTEIN-BOUND CHEMO INJECTION 100 MG
100.0000 mg/m2 | Freq: Once | INTRAVENOUS | Status: AC
  Administered 2019-05-12: 200 mg via INTRAVENOUS
  Filled 2019-05-12: qty 40

## 2019-05-12 MED ORDER — HEPARIN SOD (PORK) LOCK FLUSH 100 UNIT/ML IV SOLN
500.0000 [IU] | Freq: Once | INTRAVENOUS | Status: AC | PRN
  Administered 2019-05-12: 500 [IU]
  Filled 2019-05-12: qty 5

## 2019-05-12 MED ORDER — SODIUM CHLORIDE 0.9% FLUSH
10.0000 mL | INTRAVENOUS | Status: DC | PRN
  Administered 2019-05-12: 10 mL
  Filled 2019-05-12: qty 10

## 2019-05-12 MED ORDER — PROCHLORPERAZINE MALEATE 10 MG PO TABS
10.0000 mg | ORAL_TABLET | Freq: Once | ORAL | Status: AC
  Administered 2019-05-12: 10 mg via ORAL

## 2019-05-12 NOTE — Progress Notes (Signed)
Met with patient in infusion to introduce myself as Arboriculturist and to offer available resources.  Discussed one-time $3 Engineer, drilling to assist with personal expenses while going through treatment.  Gave him my card if interested in applying and for any additional financial questions or concerns.

## 2019-05-12 NOTE — Progress Notes (Signed)
Left lower extremity venous duplex has been completed. Preliminary results can be found in CV Proc through chart review.  Results were given to Tammy at Ned Card' NP office.  05/12/19 2:51 PM Carlos Levering RVT

## 2019-05-12 NOTE — Patient Instructions (Signed)
[facility] Discharge Instructions for Patients Receiving Chemotherapy  Today you received the following chemotherapy agents: Abraxane (Nab-paclitaxel protein bound), Gemcitabine   To help prevent nausea and vomiting after your treatment, we encourage you to take your nausea medication as directed.   If you develop nausea and vomiting that is not controlled by your nausea medication, call the clinic.   BELOW ARE SYMPTOMS THAT SHOULD BE REPORTED IMMEDIATELY:  *FEVER GREATER THAN 100.5 F  *CHILLS WITH OR WITHOUT FEVER  NAUSEA AND VOMITING THAT IS NOT CONTROLLED WITH YOUR NAUSEA MEDICATION  *UNUSUAL SHORTNESS OF BREATH  *UNUSUAL BRUISING OR BLEEDING  TENDERNESS IN MOUTH AND THROAT WITH OR WITHOUT PRESENCE OF ULCERS  *URINARY PROBLEMS  *BOWEL PROBLEMS  UNUSUAL RASH Items with * indicate a potential emergency and should be followed up as soon as possible.  Feel free to call the clinic should you have any questions or concerns. The clinic phone number is [phone].  Please show the [facility] at check-in to the Emergency Department and triage nurse.  Nanoparticle Albumin-Bound Paclitaxel injection What is this medicine? NANOPARTICLE ALBUMIN-BOUND PACLITAXEL (Na no PAHR ti kuhl al BYOO muhn-bound PAK li TAX el) is a chemotherapy drug. It targets fast dividing cells, like cancer cells, and causes these cells to die. This medicine is used to treat advanced breast cancer, lung cancer, and pancreatic cancer. This medicine may be used for other purposes; ask your health care provider or pharmacist if you have questions. COMMON BRAND NAME(S): Abraxane What should I tell my health care provider before I take this medicine? They need to know if you have any of these conditions:  kidney disease  liver disease  low blood counts, like low white cell, platelet, or red cell counts  lung or breathing disease, like asthma  tingling of the fingers or toes, or other  nerve disorder  an unusual or allergic reaction to paclitaxel, albumin, other chemotherapy, other medicines, foods, dyes, or preservatives  pregnant or trying to get pregnant  breast-feeding How should I use this medicine? This drug is given as an infusion into a vein. It is administered in a hospital or clinic by a specially trained health care professional. Talk to your pediatrician regarding the use of this medicine in children. Special care may be needed. Overdosage: If you think you have taken too much of this medicine contact a poison control center or emergency room at once. NOTE: This medicine is only for you. Do not share this medicine with others. What if I miss a dose? It is important not to miss your dose. Call your doctor or health care professional if you are unable to keep an appointment. What may interact with this medicine? This medicine may interact with the following medications:  antiviral medicines for hepatitis, HIV or AIDS  certain antibiotics like erythromycin and clarithromycin  certain medicines for fungal infections like ketoconazole and itraconazole  certain medicines for seizures like carbamazepine, phenobarbital, phenytoin  gemfibrozil  nefazodone  rifampin  St. John's wort This list may not describe all possible interactions. Give your health care provider a list of all the medicines, herbs, non-prescription drugs, or dietary supplements you use. Also tell them if you smoke, drink alcohol, or use illegal drugs. Some items may interact with your medicine. What should I watch for while using this medicine? Your condition will be monitored carefully while you are receiving this medicine. You will need important blood work done while you are taking this medicine. This  medicine can cause serious allergic reactions. If you experience allergic reactions like skin rash, itching or hives, swelling of the face, lips, or tongue, tell your doctor or health care  professional right away. In some cases, you may be given additional medicines to help with side effects. Follow all directions for their use. This drug may make you feel generally unwell. This is not uncommon, as chemotherapy can affect healthy cells as well as cancer cells. Report any side effects. Continue your course of treatment even though you feel ill unless your doctor tells you to stop. Call your doctor or health care professional for advice if you get a fever, chills or sore throat, or other symptoms of a cold or flu. Do not treat yourself. This drug decreases your body's ability to fight infections. Try to avoid being around people who are sick. This medicine may increase your risk to bruise or bleed. Call your doctor or health care professional if you notice any unusual bleeding. Be careful brushing and flossing your teeth or using a toothpick because you may get an infection or bleed more easily. If you have any dental work done, tell your dentist you are receiving this medicine. Avoid taking products that contain aspirin, acetaminophen, ibuprofen, naproxen, or ketoprofen unless instructed by your doctor. These medicines may hide a fever. Do not become pregnant while taking this medicine or for 6 months after stopping it. Women should inform their doctor if they wish to become pregnant or think they might be pregnant. Men should not father a child while taking this medicine or for 3 months after stopping it. There is a potential for serious side effects to an unborn child. Talk to your health care professional or pharmacist for more information. Do not breast-feed an infant while taking this medicine or for 2 weeks after stopping it. This medicine may interfere with the ability to get pregnant or to father a child. You should talk to your doctor or health care professional if you are concerned about your fertility. What side effects may I notice from receiving this medicine? Side effects that  you should report to your doctor or health care professional as soon as possible:  allergic reactions like skin rash, itching or hives, swelling of the face, lips, or tongue  breathing problems  changes in vision  fast, irregular heartbeat  low blood pressure  mouth sores  pain, tingling, numbness in the hands or feet  signs of decreased platelets or bleeding - bruising, pinpoint red spots on the skin, black, tarry stools, blood in the urine  signs of decreased red blood cells - unusually weak or tired, feeling faint or lightheaded, falls  signs of infection - fever or chills, cough, sore throat, pain or difficulty passing urine  signs and symptoms of liver injury like dark yellow or brown urine; general ill feeling or flu-like symptoms; light-colored stools; loss of appetite; nausea; right upper belly pain; unusually weak or tired; yellowing of the eyes or skin  swelling of the ankles, feet, hands  unusually slow heartbeat Side effects that usually do not require medical attention (report to your doctor or health care professional if they continue or are bothersome):  diarrhea  hair loss  loss of appetite  nausea, vomiting  tiredness This list may not describe all possible side effects. Call your doctor for medical advice about side effects. You may report side effects to FDA at 1-800-FDA-1088. Where should I keep my medicine? This drug is given in a hospital  or clinic and will not be stored at home. NOTE: This sheet is a summary. It may not cover all possible information. If you have questions about this medicine, talk to your doctor, pharmacist, or health care provider.  2020 Elsevier/Gold Standard (2017-02-09 13:03:45)    Gemcitabine injection What is this medicine? GEMCITABINE (jem SYE ta been) is a chemotherapy drug. This medicine is used to treat many types of cancer like breast cancer, lung cancer, pancreatic cancer, and ovarian cancer. This medicine may be  used for other purposes; ask your health care provider or pharmacist if you have questions. COMMON BRAND NAME(S): Gemzar, Infugem What should I tell my health care provider before I take this medicine? They need to know if you have any of these conditions:  blood disorders  infection  kidney disease  liver disease  lung or breathing disease, like asthma  recent or ongoing radiation therapy  an unusual or allergic reaction to gemcitabine, other chemotherapy, other medicines, foods, dyes, or preservatives  pregnant or trying to get pregnant  breast-feeding How should I use this medicine? This drug is given as an infusion into a vein. It is administered in a hospital or clinic by a specially trained health care professional. Talk to your pediatrician regarding the use of this medicine in children. Special care may be needed. Overdosage: If you think you have taken too much of this medicine contact a poison control center or emergency room at once. NOTE: This medicine is only for you. Do not share this medicine with others. What if I miss a dose? It is important not to miss your dose. Call your doctor or health care professional if you are unable to keep an appointment. What may interact with this medicine?  medicines to increase blood counts like filgrastim, pegfilgrastim, sargramostim  some other chemotherapy drugs like cisplatin  vaccines Talk to your doctor or health care professional before taking any of these medicines:  acetaminophen  aspirin  ibuprofen  ketoprofen  naproxen This list may not describe all possible interactions. Give your health care provider a list of all the medicines, herbs, non-prescription drugs, or dietary supplements you use. Also tell them if you smoke, drink alcohol, or use illegal drugs. Some items may interact with your medicine. What should I watch for while using this medicine? Visit your doctor for checks on your progress. This drug  may make you feel generally unwell. This is not uncommon, as chemotherapy can affect healthy cells as well as cancer cells. Report any side effects. Continue your course of treatment even though you feel ill unless your doctor tells you to stop. In some cases, you may be given additional medicines to help with side effects. Follow all directions for their use. Call your doctor or health care professional for advice if you get a fever, chills or sore throat, or other symptoms of a cold or flu. Do not treat yourself. This drug decreases your body's ability to fight infections. Try to avoid being around people who are sick. This medicine may increase your risk to bruise or bleed. Call your doctor or health care professional if you notice any unusual bleeding. Be careful brushing and flossing your teeth or using a toothpick because you may get an infection or bleed more easily. If you have any dental work done, tell your dentist you are receiving this medicine. Avoid taking products that contain aspirin, acetaminophen, ibuprofen, naproxen, or ketoprofen unless instructed by your doctor. These medicines may hide a fever. Do  not become pregnant while taking this medicine or for 6 months after stopping it. Women should inform their doctor if they wish to become pregnant or think they might be pregnant. Men should not father a child while taking this medicine and for 3 months after stopping it. There is a potential for serious side effects to an unborn child. Talk to your health care professional or pharmacist for more information. Do not breast-feed an infant while taking this medicine or for at least 1 week after stopping it. Men should inform their doctors if they wish to father a child. This medicine may lower sperm counts. Talk with your doctor or health care professional if you are concerned about your fertility. What side effects may I notice from receiving this medicine? Side effects that you should report  to your doctor or health care professional as soon as possible:  allergic reactions like skin rash, itching or hives, swelling of the face, lips, or tongue  breathing problems  pain, redness, or irritation at site where injected  signs and symptoms of a dangerous change in heartbeat or heart rhythm like chest pain; dizziness; fast or irregular heartbeat; palpitations; feeling faint or lightheaded, falls; breathing problems  signs of decreased platelets or bleeding - bruising, pinpoint red spots on the skin, black, tarry stools, blood in the urine  signs of decreased red blood cells - unusually weak or tired, feeling faint or lightheaded, falls  signs of infection - fever or chills, cough, sore throat, pain or difficulty passing urine  signs and symptoms of kidney injury like trouble passing urine or change in the amount of urine  signs and symptoms of liver injury like dark yellow or brown urine; general ill feeling or flu-like symptoms; light-colored stools; loss of appetite; nausea; right upper belly pain; unusually weak or tired; yellowing of the eyes or skin  swelling of ankles, feet, hands Side effects that usually do not require medical attention (report to your doctor or health care professional if they continue or are bothersome):  constipation  diarrhea  hair loss  loss of appetite  nausea  rash  vomiting This list may not describe all possible side effects. Call your doctor for medical advice about side effects. You may report side effects to FDA at 1-800-FDA-1088. Where should I keep my medicine? This drug is given in a hospital or clinic and will not be stored at home. NOTE: This sheet is a summary. It may not cover all possible information. If you have questions about this medicine, talk to your doctor, pharmacist, or health care provider.  2020 Elsevier/Gold Standard (2017-09-01 18:06:11)

## 2019-05-12 NOTE — Progress Notes (Addendum)
[facility] OFFICE PROGRESS NOTE   Diagnosis: Metastatic adenocarcinoma unknown primary  INTERVAL HISTORY:   Mr. Darius Ellis returns as scheduled.  He noted improvement in the dyspnea following the paracentesis.  He feels the fluid is beginning to reaccumulate.  He states he is depressed due to head congestion which is affecting his hearing.  He recently began Flonase.  He plans to try Sudafed.  Appetite remains poor.  Bowels are moving.  Objective:  Vital signs in last 24 hours:  Blood pressure 107/66, pulse 77, temperature 98.2 F (36.8 C), temperature source Temporal, resp. rate 17, height 6\' 1"  (1.854 m), weight 187 lb 9.6 oz (85.1 kg), SpO2 100 %.    GI: Abdomen is distended.  He appears to have ascites. Vascular: Trace edema right lower leg.  Edema throughout the left lower leg. Neuro: Alert and oriented. Skin: Chronic stasis change at the lower legs bilaterally. Port-A-Cath without erythema.   Lab Results:  Lab Results  Component Value Date   WBC 15.9 (H) 05/12/2019   HGB 14.1 05/12/2019   HCT 45.3 05/12/2019   MCV 93.2 05/12/2019   PLT 167 05/12/2019   NEUTROABS 13.5 (H) 05/12/2019    Imaging:  No results found.  Medications: I have reviewed the patient's current medications.  Assessment/Plan: 1. Metastatic adenocarcinoma unknown primary  CTs6/01/2019-large mass left hepatic lobe measuring 9.1 cm, few ill-defined lesions in the right hepatic lobe measuring up to 9 mm.  Indeterminate gastrohepatic ligament lymph node measuring 1.1 cm.  Shotty periportal and right pericardial adenopathy.  Biopsy liver lesion6/03/2019 -adenocarcinoma unknown primary most likely pancreatobiliary tree(including intrahepatic cholangiocarcinoma)and upper GI tract;CK7 positive; CK20, TTF-1, CDX2, GATA 3, p63 and PSA negative  Colonoscopy 12/09/2018-internal hemorrhoids, mild. Examination otherwise normal.  Upper endoscopy 12/09/2018-examined duodenum normal.  Diffuse mild inflammation characterized by congestion and erythema in the gastric antrum.Biopsy showed chronic active gastritis with intestinal metaplasia; immunohistochemical staining positive for H pylori.  CT chest 12/13/2018-heterogeneous hypodense nodule left thyroid lobe. Previously reported small bilateral pleural effusions have resolved. Trace pericardial effusion. Interval development of several small lymph nodes in the right anterior cardiophrenic space measuring up to 1.2 x 0.8 cm. Also mildly prominent lymph nodes in the periceliac region, gastrohepatic ligament and periportal area.  12/13/2018 CEA 8.2   12/28/2018 PET scan-multiple intensely FDG avid hepatic metastases. FDG avid anterior cardiophrenic, periportal, periceliac and gastrohepatic ligament lymph nodes. New trace right pleural effusion. Stable trace pericardial effusion.  Cycle 1 FOLFOX 01/10/2019   Cycle 2 FOLFOX 01/24/2019   01/24/2019 CEA 13.3, CA 19-9 163  CTs 02/01/2019-multiple confluent liver lesions noted occupying nearly the entire left lobe. More separated lesions in the right lobe have increased in size. At least 10-12 enlarging/new lesions in the right lobe of the liver.  Cycle 3 FOLFOX 02/07/2019  03/03/2019-left hepatic Y90  03/31/2019-right hepatic Y90  CTs 05/02/2019-liver lesions have significantly increased in number; others are stable to increased in size since 02/01/2019 CT.  Enlarged gastrohepatic ligament, portacaval, left periaortic and aortocaval lymph nodes appear stable.  Anasarca, ascites and pleural effusions are essentially new.  Cycle 1 gemcitabine/Abraxane 05/12/2019 2. BPH 3. Hearing loss 4. Neuropathy 5. History of H. pylori status post treatment 6. History of atrial fibrillation 7. Asymmetric leg edema-referred for Doppler 8. Ascites-paracentesis 05/05/2019 with 5 L removed, cytology with reactive mesothelial cells.  Disposition: Darius Ellis appears unchanged.  Performance  status remains borderline.  Plan to proceed with cycle 1 gemcitabine/Abraxane today as scheduled.  On exam he has asymmetric  leg edema.  We are referring him for a venous Doppler.  We reviewed the labs from today, adequate for treatment.  He would like to have another paracentesis on 05/22/2019.  We made a referral.  He will return for lab, follow-up, cycle 2 gemcitabine/Abraxane in 2 weeks.  He will contact the office in the interim with any problems.  Patient seen with Dr. Benay Spice.  25 minutes were spent face-to-face at today's visit with the majority of that time involved in counseling/coordination of care.    Ned Card ANP/GNP-BC   05/12/2019  9:32 AM  This was a shared visit with Ned Card.  Mr. Darius Ellis was interviewed and examined.  He will begin gemcitabine/Abraxane today.  We are referring him for a Doppler left leg.  He will return for an office visit and chemotherapy in 2 weeks.  Julieanne Manson, MD

## 2019-05-15 ENCOUNTER — Ambulatory Visit: Payer: Managed Care, Other (non HMO) | Admitting: Family Medicine

## 2019-05-15 ENCOUNTER — Encounter: Payer: Self-pay | Admitting: Family Medicine

## 2019-05-15 ENCOUNTER — Other Ambulatory Visit: Payer: Self-pay

## 2019-05-15 ENCOUNTER — Telehealth: Payer: Self-pay | Admitting: *Deleted

## 2019-05-15 ENCOUNTER — Telehealth: Payer: Self-pay | Admitting: Oncology

## 2019-05-15 VITALS — BP 110/70 | HR 86 | Ht 73.0 in | Wt 192.0 lb

## 2019-05-15 DIAGNOSIS — R0981 Nasal congestion: Secondary | ICD-10-CM

## 2019-05-15 DIAGNOSIS — H938X3 Other specified disorders of ear, bilateral: Secondary | ICD-10-CM

## 2019-05-15 MED ORDER — METHYLPREDNISOLONE SODIUM SUCC 125 MG IJ SOLR
125.0000 mg | Freq: Once | INTRAMUSCULAR | Status: AC
  Administered 2019-05-15: 125 mg via INTRAMUSCULAR

## 2019-05-15 MED ORDER — PREDNISONE 10 MG (48) PO TBPK: ORAL_TABLET | ORAL | 0 refills | Status: DC

## 2019-05-15 NOTE — Telephone Encounter (Signed)
Scheduled per los. Called and left msg. Mailed printout  °

## 2019-05-15 NOTE — Progress Notes (Signed)
Established Patient Office Visit  Subjective:  Patient ID: Darius Ellis, male    DOB: [birth year]  Age: 78 y.o. MRN: [id number]  CC:  Chief Complaint  Patient presents with  . Follow-up    HPI Darius Ellis presents for follow-up of his nasal and sinus and ear congestion.  Prednisone for 7 days did not seem to help much.  He is continuing with the nasal steroid.  Daughter is purchased some Afrin.  Was advised not to take pseudoephedrine with his current chemotherapy regimen.  Daughter now tells me that the sinus and ear congestion have been ongoing with him.  Normally uses hearing aids but has not used them in some time.  He is miserable with this.  CT of his head obtained back on 19 October was negative for the visualized portion of the paranasal sinuses.  He has no rhinorrhea postnasal drip pressure or teeth pain.  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  Past Medical History:  Diagnosis Date  . A-fib ([place])   . H. pylori infection    Treated and healed  . Hypertension   . 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 PTSD (post-traumatic stress disorder) 2006    Past Surgical History:  Procedure Laterality Date  . BLADDER SURGERY     Stones  . Left Hip Replacement Left   . radioactive embolization     Liver    Family History  Problem Relation Age of Onset  . Polycystic kidney disease Mother     Social History   Socioeconomic History  . Marital status: Married    Spouse name: Not on file  . Number of children: Not on file  . Years of education: Not on file  . Highest education level: Not on file  Occupational History  . Not on file  Social Needs  . Financial resource strain: Not on file  . Food insecurity    Worry: Not on file    Inability: Not on file  . Transportation needs    Medical: Not on file   Non-medical: Not on file  Tobacco Use  . Smoking status: Current Every Day Smoker    Types: Pipe  . Smokeless tobacco: Current User  Substance and Sexual Activity  . Alcohol use: Not Currently  . Drug use: Never  . Sexual activity: Not on file  Lifestyle  . Physical activity    Days per week: Not on file    Minutes per session: Not on file  . Stress: Not on file  Relationships  . Social Herbalist on phone: Not on file    Gets together: Not on file    Attends religious service: Not on file    Active member of club or organization: Not on file    Attends meetings of clubs or organizations: Not on file    Relationship status: Not on file  . Intimate partner violence    Fear of current or ex partner: Not on file    Emotionally abused: Not on file    Physically abused: Not on file    Forced sexual activity: Not on file  Other Topics Concern  . Not on file  Social History Narrative  . Not on file    Outpatient Medications Prior to Visit  Medication Sig Dispense Refill  . amLODipine-benazepril (LOTREL)  5-10 MG capsule Take 1 capsule by mouth daily. 5-10 mg daily    . Apixaban Starter Pack (ELIQUIS STARTER PACK) 5 MG TBPK Take as directed on package: start with two-5mg  tablets twice daily for 7 days. On day 8, switch to one-5mg  tablet twice daily. 1 each 0  . Ascorbic Acid (VITAMIN C) 1000 MG tablet Take 1,000 mg by mouth daily.    . Cholecalciferol (D3 VITAMIN PO) Take by mouth daily. 1000 units daily    . Cyanocobalamin (VITAMIN B 12 PO) Take by mouth daily. 1000 mcg at dinner    . finasteride (PROSCAR) 5 MG tablet Take 5 mg by mouth daily.    . fluticasone (FLONASE) 50 MCG/ACT nasal spray Place 2 sprays into both nostrils daily. 16 g 6  . gabapentin (NEURONTIN) 300 MG capsule Take 300 mg by mouth 2 (two) times daily. 2 capsules 2 times daily    . GLUCOSAMINE-CHONDROITIN PO Take 1 tablet by mouth 2 (two) times daily. Glucosamine 1500 mg/chondroitin 1200 mg    .  HYDROcodone-acetaminophen (NORCO) 5-325 MG tablet Take 1-2 tablets by mouth every 6 (six) hours as needed for moderate pain. 40 tablet 0  . Misc Natural Products (LUTEIN 20 PO) Take 1 tablet by mouth daily. 20 mg daily    . Multiple Vitamin (MULTIVITAMIN) tablet Take 1 tablet by mouth daily.    . ondansetron (ZOFRAN) 8 MG tablet Take by mouth every 8 (eight) hours as needed for nausea or vomiting.    . prochlorperazine (COMPAZINE) 10 MG tablet Take 10 mg by mouth every 6 (six) hours as needed for nausea or vomiting.    . tamsulosin (FLOMAX) 0.4 MG CAPS capsule Take 0.4 mg by mouth.    . topiramate (TOPAMAX) 25 MG tablet Take 25 mg by mouth at bedtime. 3 tablets at bedtime     No facility-administered medications prior to visit.     No Known Allergies  ROS Review of Systems  Constitutional: Negative for chills, diaphoresis, fatigue, fever and unexpected weight change.  HENT: Positive for congestion and hearing loss. Negative for ear pain, nosebleeds, postnasal drip, rhinorrhea, sinus pressure, sinus pain, sneezing and sore throat.   Eyes: Negative for photophobia.  Respiratory: Negative.   Cardiovascular: Negative.   Gastrointestinal: Negative.   Genitourinary: Negative.   Neurological: Negative for seizures, speech difficulty, weakness, light-headedness, numbness and headaches.  Hematological: Negative.   Psychiatric/Behavioral: Negative.       Objective:    Physical Exam  Constitutional: He is oriented to person, place, and time. He appears well-developed and well-nourished. No distress.  HENT:  Head: Normocephalic and atraumatic.  Right Ear: External ear normal. Tympanic membrane is retracted. Tympanic membrane is not injected. No middle ear effusion.  Left Ear: External ear normal. Tympanic membrane is retracted. Tympanic membrane is not injected.  No middle ear effusion.  Nose: Right sinus exhibits no maxillary sinus tenderness and no frontal sinus tenderness. Left sinus  exhibits no maxillary sinus tenderness and no frontal sinus tenderness.  Mouth/Throat: Oropharynx is clear and moist. Mucous membranes are dry. No oropharyngeal exudate.  Eyes: Pupils are equal, round, and reactive to light. Conjunctivae are normal. Right eye exhibits no discharge. Left eye exhibits no discharge. No scleral icterus.  Neck: No JVD present. No tracheal deviation present. No thyromegaly present.  Cardiovascular: Normal rate, regular rhythm and normal heart sounds.  Pulmonary/Chest: Effort normal and breath sounds normal. No stridor. No respiratory distress.  Abdominal: Bowel sounds are normal.  Musculoskeletal:  General: No edema.  Lymphadenopathy:    He has no cervical adenopathy.  Neurological: He is alert and oriented to person, place, and time.  Skin: Skin is warm and dry. He is not diaphoretic.  Psychiatric: He has a normal mood and affect. His behavior is normal.    BP 110/70   Pulse 86   Ht 6\' 1"  (1.854 m)   Wt 192 lb (87.1 kg)   SpO2 96%   BMI 25.33 kg/m  Wt Readings from Last 3 Encounters:  05/15/19 192 lb (87.1 kg)  05/12/19 187 lb 9.6 oz (85.1 kg)  05/05/19 196 lb 12.8 oz (89.3 kg)   BP Readings from Last 3 Encounters:  05/15/19 110/70  05/12/19 107/66  05/05/19 121/75   Guideline developer:  UpToDate (see UpToDate for funding source) Date Released: June 2014  Health Maintenance Due  Topic Date Due  . Samul Dada  12/31/1958  . PNA vac Low Risk Adult (1 of 2 - PCV13) 12/30/2004  . INFLUENZA VACCINE  01/21/2019    There are no preventive care reminders to display for this patient.  No results found for: TSH Lab Results  Component Value Date   WBC 15.9 (H) 05/12/2019   HGB 14.1 05/12/2019   HCT 45.3 05/12/2019   MCV 93.2 05/12/2019   PLT 167 05/12/2019   Lab Results  Component Value Date   NA 140 05/12/2019   K 4.3 05/12/2019   CO2 23 05/12/2019   GLUCOSE 99 05/12/2019   BUN 35 (H) 05/12/2019   CREATININE 1.45 (H) 05/12/2019    BILITOT 1.0 05/12/2019   ALKPHOS 262 (H) 05/12/2019   AST 24 05/12/2019   ALT 14 05/12/2019   PROT 6.0 (L) 05/12/2019   ALBUMIN 2.8 (L) 05/12/2019   CALCIUM 8.6 (L) 05/12/2019   ANIONGAP 11 05/12/2019   No results found for: CHOL No results found for: HDL No results found for: LDLCALC No results found for: TRIG No results found for: CHOLHDL No results found for: HGBA1C    Assessment & Plan:   Problem List Items Addressed This Visit    None    Visit Diagnoses    Sinus congestion    -  Primary   Relevant Medications   predniSONE (STERAPRED UNI-PAK 48 TAB) 10 MG (48) TBPK tablet   methylPREDNISolone sodium succinate (SOLU-MEDROL) 125 mg/2 mL injection 125 mg (Completed) (Start on 05/15/2019  2:45 PM)   Ear congestion, bilateral       Relevant Medications   predniSONE (STERAPRED UNI-PAK 48 TAB) 10 MG (48) TBPK tablet   methylPREDNISolone sodium succinate (SOLU-MEDROL) 125 mg/2 mL injection 125 mg (Completed) (Start on 05/15/2019  2:45 PM)      Meds ordered this encounter  Medications  . predniSONE (STERAPRED UNI-PAK 48 TAB) 10 MG (48) TBPK tablet    Sig: Pharm to instruct a 12 day dose pack.    Dispense:  48 tablet    Refill:  0  . methylPREDNISolone sodium succinate (SOLU-MEDROL) 125 mg/2 mL injection 125 mg    Follow-up: Return in about 2 weeks (around 05/29/2019).    Continue nasal sternal steroid use Afrin as needed.  Will start [place] tomorrow.  Discussed prednisone side effects.

## 2019-05-15 NOTE — Telephone Encounter (Signed)
Called pt to see how he did with his treatment last week.  He reports having some stomach issues, "discomfort"  Night of treatment & he took his nausea med which helped some. He reports fluid around his stomach & has paracentesis scheduled for 11/30.  Informed to call if he is worse & doesn't feel he can wait.  He denies any other symptoms & states " I got through it."

## 2019-05-15 NOTE — Telephone Encounter (Signed)
-----   Message from Rennis Harding, RN sent at 05/12/2019  2:20 PM EST ----- Regarding: Dr. Benay Spice First Time Chemo F/U 1st time chemo f/u - Abraxane and Gemzar - tolerated well

## 2019-05-21 ENCOUNTER — Other Ambulatory Visit: Payer: Self-pay | Admitting: Oncology

## 2019-05-22 ENCOUNTER — Other Ambulatory Visit: Payer: Self-pay

## 2019-05-22 ENCOUNTER — Ambulatory Visit (HOSPITAL_COMMUNITY)
Admission: RE | Admit: 2019-05-22 | Discharge: 2019-05-22 | Disposition: A | Discharge status: Home / Self Care | Payer: Managed Care, Other (non HMO) | Source: Ambulatory Visit | Attending: Nurse Practitioner | Admitting: Nurse Practitioner

## 2019-05-22 DIAGNOSIS — C801 Malignant (primary) neoplasm, unspecified: Secondary | ICD-10-CM | POA: Insufficient documentation

## 2019-05-22 DIAGNOSIS — C787 Secondary malignant neoplasm of liver and intrahepatic bile duct: Secondary | ICD-10-CM | POA: Diagnosis present

## 2019-05-22 MED ORDER — LIDOCAINE HCL 1 % IJ SOLN
INTRAMUSCULAR | Status: AC
  Filled 2019-05-22: qty 10

## 2019-05-22 NOTE — Procedures (Signed)
Ultrasound-guided diagnostic and therapeutic paracentesis performed yielding 5.0 liters maximum per Team's request of straw colored colored fluid.  Fluid was sent to lab for analysis. No immediate complications. EBL is none.

## 2019-05-24 LAB — CYTOLOGY - NON PAP

## 2019-05-26 ENCOUNTER — Other Ambulatory Visit: Payer: Self-pay | Admitting: *Deleted

## 2019-05-26 ENCOUNTER — Ambulatory Visit
Admission: RE | Admit: 2019-05-26 | Discharge: 2019-05-26 | Disposition: A | Discharge status: Home / Self Care | Payer: Self-pay | Source: Ambulatory Visit | Attending: Oncology | Admitting: Oncology

## 2019-05-26 ENCOUNTER — Inpatient Hospital Stay: Payer: Managed Care, Other (non HMO)

## 2019-05-26 ENCOUNTER — Other Ambulatory Visit: Payer: Self-pay

## 2019-05-26 ENCOUNTER — Inpatient Hospital Stay: Payer: Managed Care, Other (non HMO) | Attending: Nurse Practitioner | Admitting: Oncology

## 2019-05-26 VITALS — BP 124/83 | HR 75 | Temp 97.8°F | Resp 17 | Ht 73.0 in | Wt 182.8 lb

## 2019-05-26 DIAGNOSIS — C801 Malignant (primary) neoplasm, unspecified: Secondary | ICD-10-CM | POA: Insufficient documentation

## 2019-05-26 DIAGNOSIS — Z7901 Long term (current) use of anticoagulants: Secondary | ICD-10-CM | POA: Insufficient documentation

## 2019-05-26 DIAGNOSIS — Z95828 Presence of other vascular implants and grafts: Secondary | ICD-10-CM | POA: Insufficient documentation

## 2019-05-26 DIAGNOSIS — Z5111 Encounter for antineoplastic chemotherapy: Secondary | ICD-10-CM | POA: Insufficient documentation

## 2019-05-26 DIAGNOSIS — I4891 Unspecified atrial fibrillation: Secondary | ICD-10-CM | POA: Insufficient documentation

## 2019-05-26 DIAGNOSIS — R188 Other ascites: Secondary | ICD-10-CM | POA: Insufficient documentation

## 2019-05-26 DIAGNOSIS — C787 Secondary malignant neoplasm of liver and intrahepatic bile duct: Secondary | ICD-10-CM | POA: Insufficient documentation

## 2019-05-26 DIAGNOSIS — I82402 Acute embolism and thrombosis of unspecified deep veins of left lower extremity: Secondary | ICD-10-CM | POA: Diagnosis not present

## 2019-05-26 DIAGNOSIS — J90 Pleural effusion, not elsewhere classified: Secondary | ICD-10-CM | POA: Diagnosis not present

## 2019-05-26 DIAGNOSIS — G629 Polyneuropathy, unspecified: Secondary | ICD-10-CM | POA: Diagnosis not present

## 2019-05-26 LAB — CMP (CANCER CENTER ONLY)
ALT: 34 U/L (ref 0–44)
AST: 37 U/L (ref 15–41)
Albumin: 2.5 g/dL — ABNORMAL LOW (ref 3.5–5.0)
Alkaline Phosphatase: 297 U/L — ABNORMAL HIGH (ref 38–126)
Anion gap: 8 (ref 5–15)
BUN: 29 mg/dL — ABNORMAL HIGH (ref 8–23)
CO2: 22 mmol/L (ref 22–32)
Calcium: 8.6 mg/dL — ABNORMAL LOW (ref 8.9–10.3)
Chloride: 107 mmol/L (ref 98–111)
Creatinine: 1.1 mg/dL (ref 0.61–1.24)
GFR, Est AFR Am: >60 mL/min (ref >60)
GFR, Estimated: >60 mL/min (ref >60)
Glucose, Bld: 105 mg/dL — ABNORMAL HIGH (ref 70–99)
Potassium: 3.9 mmol/L (ref 3.5–5.1)
Sodium: 137 mmol/L (ref 135–145)
Total Bilirubin: 0.8 mg/dL (ref 0.3–1.2)
Total Protein: 5.3 g/dL — ABNORMAL LOW (ref 6.5–8.1)

## 2019-05-26 LAB — CBC WITH DIFFERENTIAL (CANCER CENTER ONLY)
Abs Immature Granulocytes: 0.47 10*3/uL — ABNORMAL HIGH (ref 0.00–0.07)
Basophils Absolute: 0.1 10*3/uL (ref 0.0–0.1)
Basophils Relative: 1 %
Eosinophils Absolute: 0.1 10*3/uL (ref 0.0–0.5)
Eosinophils Relative: 1 %
HCT: 43.5 % (ref 39.0–52.0)
Hemoglobin: 13.9 g/dL (ref 13.0–17.0)
Immature Granulocytes: 8 %
Lymphocytes Relative: 9 %
Lymphs Abs: 0.5 10*3/uL — ABNORMAL LOW (ref 0.7–4.0)
MCH: 29.3 pg (ref 26.0–34.0)
MCHC: 32 g/dL (ref 30.0–36.0)
MCV: 91.6 fL (ref 80.0–100.0)
Monocytes Absolute: 1.1 10*3/uL — ABNORMAL HIGH (ref 0.1–1.0)
Monocytes Relative: 19 %
Neutro Abs: 3.5 10*3/uL (ref 1.7–7.7)
Neutrophils Relative %: 62 %
Platelet Count: 218 10*3/uL (ref 150–400)
RBC: 4.75 MIL/uL (ref 4.22–5.81)
RDW: 17.2 % — ABNORMAL HIGH (ref 11.5–15.5)
WBC Count: 5.8 10*3/uL (ref 4.0–10.5)
nRBC: 0 % (ref 0.0–0.2)

## 2019-05-26 MED ORDER — HEPARIN SOD (PORK) LOCK FLUSH 100 UNIT/ML IV SOLN
500.0000 [IU] | Freq: Once | INTRAVENOUS | Status: AC | PRN
  Administered 2019-05-26: 500 [IU]
  Filled 2019-05-26: qty 5

## 2019-05-26 MED ORDER — PROCHLORPERAZINE MALEATE 10 MG PO TABS
10.0000 mg | ORAL_TABLET | Freq: Once | ORAL | Status: AC
  Administered 2019-05-26: 10 mg via ORAL

## 2019-05-26 MED ORDER — SODIUM CHLORIDE 0.9 % IV SOLN
800.0000 mg/m2 | Freq: Once | INTRAVENOUS | Status: AC
  Administered 2019-05-26: 1672 mg via INTRAVENOUS
  Filled 2019-05-26: qty 43.97

## 2019-05-26 MED ORDER — PROCHLORPERAZINE MALEATE 10 MG PO TABS
ORAL_TABLET | ORAL | Status: AC
  Filled 2019-05-26: qty 1

## 2019-05-26 MED ORDER — SODIUM CHLORIDE 0.9% FLUSH
10.0000 mL | INTRAVENOUS | Status: DC | PRN
  Administered 2019-05-26: 09:00:00 10 mL
  Filled 2019-05-26: qty 10

## 2019-05-26 MED ORDER — SODIUM CHLORIDE 0.9 % IV SOLN
Freq: Once | INTRAVENOUS | Status: AC
  Administered 2019-05-26: 10:00:00 via INTRAVENOUS
  Filled 2019-05-26: qty 250

## 2019-05-26 MED ORDER — PACLITAXEL PROTEIN-BOUND CHEMO INJECTION 100 MG
100.0000 mg/m2 | Freq: Once | INTRAVENOUS | Status: AC
  Administered 2019-05-26: 200 mg via INTRAVENOUS
  Filled 2019-05-26: qty 40

## 2019-05-26 NOTE — Progress Notes (Signed)
[facility] OFFICE PROGRESS NOTE   Diagnosis: Unknown primary carcinoma  INTERVAL HISTORY:    Mr. Roye completed a cycle of gemcitabine and Abraxane on 05/12/2019.  No fever, rash, or neuropathy symptoms.  Abdominal pain has improved.  He continues to feel weak.  He has anorexia.  He last underwent a paracentesis on 05/22/2019 for 5 L of fluid.  Dyspnea improved after the first paracentesis, but not after the procedure on 05/22/2019.  Objective:  Vital signs in last 24 hours:  Blood pressure 124/83, pulse 75, temperature 97.8 F (36.6 C), temperature source Temporal, resp. rate 17, height _0  (1.854 m), weight 182 lb 12.8 oz (82.9 kg), SpO2 100 %.    Resp: Decreased breath sounds at the right lower posterior chest, no respiratory distress Cardio: Regular rate and rhythm GI: Distended with ascites, liver is palpable in the right upper abdomen Vascular: Pitting edema to left greater than right lower leg    Portacath/PICC-without erythema  Lab Results:  Lab Results  Component Value Date   WBC 5.8 05/26/2019   HGB 13.9 05/26/2019   HCT 43.5 05/26/2019   MCV 91.6 05/26/2019   PLT 218 05/26/2019   NEUTROABS 3.5 05/26/2019    CMP  Lab Results  Component Value Date   NA 140 05/12/2019   K 4.3 05/12/2019   CL 106 05/12/2019   CO2 23 05/12/2019   GLUCOSE 99 05/12/2019   BUN 35 (H) 05/12/2019   CREATININE 1.45 (H) 05/12/2019   CALCIUM 8.6 (L) 05/12/2019   PROT 6.0 (L) 05/12/2019   ALBUMIN 2.8 (L) 05/12/2019   AST 24 05/12/2019   ALT 14 05/12/2019   ALKPHOS 262 (H) 05/12/2019   BILITOT 1.0 05/12/2019   GFRNONAA 45 (L) 05/12/2019   GFRAA 53 (L) 05/12/2019    Lab Results  Component Value Date   CEA1 25.34 (H) 04/24/2019      Imaging:  US Paracentesis  Result Date: 05/22/2019 INDICATION: Patient with history of metastatic adenocarcinoma of unknown primary with ascites. Request was made for therapeutic and diagnostic paracentesis up to 5 L  maximum EXAM: ULTRASOUND GUIDED THERAPEUTIC AND DIAGNOSTIC PARACENTESIS MEDICATIONS: None. COMPLICATIONS: None immediate. PROCEDURE: Informed written consent was obtained from the patient after a discussion of the risks, benefits and alternatives to treatment. A timeout was performed prior to the initiation of the procedure. Initial ultrasound scanning demonstrates a large amount of ascites within the right lower abdominal quadrant. The right lower abdomen was prepped and draped in the usual sterile fashion. 1% lidocaine was used for local anesthesia. Following this, a 19 gauge, 7-cm, Yueh catheter was introduced. An ultrasound image was saved for documentation purposes. The paracentesis was performed. The catheter was removed and a dressing was applied. The patient tolerated the procedure well without immediate post procedural complication. FINDINGS: A total of approximately 5 L of straw colored fluid was removed. Samples were sent to the laboratory as requested by the clinical team. IMPRESSION: Successful ultrasound-guided therapeutic and diagnostic paracentesis yielding 5 liters of peritoneal fluid. Read by Rushie Nyhan NP Electronically Signed   By: Sandi Mariscal M.D.   On: 05/22/2019 11:01    Medications: I have reviewed the patient's current medications.   Assessment/Plan: 1. Metastatic adenocarcinoma unknown primary  CTs6/01/2019-large mass left hepatic lobe measuring 9.1 cm, few ill-defined lesions in the right hepatic lobe measuring up to 9 mm.  Indeterminate gastrohepatic ligament lymph node measuring 1.1 cm.  Shotty periportal and right pericardial adenopathy.  Biopsy liver lesion6/03/2019 -adenocarcinoma  unknown primary most likely pancreatobiliary tree(including intrahepatic cholangiocarcinoma)and upper GI tract;CK7 positive; CK20, TTF-1, CDX2, GATA 3, p63 and PSA negative  Colonoscopy 12/09/2018-internal hemorrhoids, mild. Examination otherwise normal.  Upper endoscopy  12/09/2018-examined duodenum normal. Diffuse mild inflammation characterized by congestion and erythema in the gastric antrum.Biopsy showed chronic active gastritis with intestinal metaplasia; immunohistochemical staining positive for H pylori.  CT chest 12/13/2018-heterogeneous hypodense nodule left thyroid lobe. Previously reported small bilateral pleural effusions have resolved. Trace pericardial effusion. Interval development of several small lymph nodes in the right anterior cardiophrenic space measuring up to 1.2 x 0.8 cm. Also mildly prominent lymph nodes in the periceliac region, gastrohepatic ligament and periportal area.  12/13/2018 CEA 8.2   12/28/2018 PET scan-multiple intensely FDG avid hepatic metastases. FDG avid anterior cardiophrenic, periportal, periceliac and gastrohepatic ligament lymph nodes. New trace right pleural effusion. Stable trace pericardial effusion.  Cycle 1 FOLFOX 01/10/2019   Cycle 2 FOLFOX 01/24/2019   01/24/2019 CEA 13.3, CA 19-9 163  CTs 02/01/2019-multiple confluent liver lesions noted occupying nearly the entire left lobe. More separated lesions in the right lobe have increased in size. At least 10-12 enlarging/new lesions in the right lobe of the liver.  Cycle 3 FOLFOX 02/07/2019  03/03/2019-left hepatic Y90  03/31/2019-right hepatic Y90  CTs 05/02/2019-liver lesions have significantly increased in number; others are stable to increased in size since 02/01/2019 CT.  Enlarged gastrohepatic ligament, portacaval, left periaortic and aortocaval lymph nodes appear stable.  Anasarca, ascites and pleural effusions are essentially new.  Guardant 360 testing 05/12/2019- PIK3CA, IDH2 alterations, MSI high not detected, tumor mutation burden 8.57, ATM Vus  Cycle 1 gemcitabine/Abraxane 05/12/2019  Cycle 2 gemcitabine/Abraxane 05/26/2019 2. BPH 3. Hearing loss 4. Neuropathy 5. History of H. pylori status post treatment 6. History of atrial fibrillation 7.  Asymmetric leg edema-referred for Doppler 8. Ascites-paracentesis 05/05/2019 with 5 L removed, cytology with reactive mesothelial cells. 9. 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 Jerald Kief appears unchanged.  He continues to have a poor performance status.  He tolerated the first cycle of gemcitabine/Abraxane well.  His pain appears improved.  He will complete cycle 2 today.  We will refer him for therapeutic paracentesis procedures as needed.  He continues apixaban anticoagulation for the left lower extremity DVT.  I reviewed the results of the Guardant 360 testing with Mr. Jerald Kief his wife.  He does not appear to be a candidate for immunotherapy targeted therapy at present.   Betsy Coder, MD  05/26/2019  9:12 AM

## 2019-05-26 NOTE — Patient Instructions (Signed)
[facility] Discharge Instructions for Patients Receiving Chemotherapy  Today you received the following chemotherapy agents: Abraxane and Gemzar   To help prevent nausea and vomiting after your treatment, we encourage you to take your nausea medication as directed.    If you develop nausea and vomiting that is not controlled by your nausea medication, call the clinic.   BELOW ARE SYMPTOMS THAT SHOULD BE REPORTED IMMEDIATELY:  *FEVER GREATER THAN 100.5 F  *CHILLS WITH OR WITHOUT FEVER  NAUSEA AND VOMITING THAT IS NOT CONTROLLED WITH YOUR NAUSEA MEDICATION  *UNUSUAL SHORTNESS OF BREATH  *UNUSUAL BRUISING OR BLEEDING  TENDERNESS IN MOUTH AND THROAT WITH OR WITHOUT PRESENCE OF ULCERS  *URINARY PROBLEMS  *BOWEL PROBLEMS  UNUSUAL RASH Items with * indicate a potential emergency and should be followed up as soon as possible.  Feel free to call the clinic you have any questions or concerns. The clinic phone number is [phone].  Please show the CHEMO ALERT CARD at check-in to the Emergency Department and triage nurse.   

## 2019-05-27 ENCOUNTER — Emergency Department (HOSPITAL_COMMUNITY): Payer: Managed Care, Other (non HMO)

## 2019-05-27 ENCOUNTER — Inpatient Hospital Stay (HOSPITAL_COMMUNITY): Payer: Managed Care, Other (non HMO)

## 2019-05-27 ENCOUNTER — Encounter (HOSPITAL_COMMUNITY): Payer: Self-pay

## 2019-05-27 ENCOUNTER — Inpatient Hospital Stay (HOSPITAL_COMMUNITY)
Admission: EM | Admit: 2019-05-27 | Discharge: 2019-06-02 | DRG: 435 | Disposition: A | Discharge status: Hospice | Payer: Managed Care, Other (non HMO) | Attending: Internal Medicine | Admitting: Internal Medicine

## 2019-05-27 ENCOUNTER — Other Ambulatory Visit: Payer: Self-pay

## 2019-05-27 DIAGNOSIS — I10 Essential (primary) hypertension: Secondary | ICD-10-CM | POA: Diagnosis present

## 2019-05-27 DIAGNOSIS — R0602 Shortness of breath: Secondary | ICD-10-CM | POA: Diagnosis not present

## 2019-05-27 DIAGNOSIS — R188 Other ascites: Secondary | ICD-10-CM | POA: Diagnosis not present

## 2019-05-27 DIAGNOSIS — R778 Other specified abnormalities of plasma proteins: Secondary | ICD-10-CM | POA: Diagnosis present

## 2019-05-27 DIAGNOSIS — C787 Secondary malignant neoplasm of liver and intrahepatic bile duct: Principal | ICD-10-CM | POA: Diagnosis present

## 2019-05-27 DIAGNOSIS — I951 Orthostatic hypotension: Secondary | ICD-10-CM

## 2019-05-27 DIAGNOSIS — N400 Enlarged prostate without lower urinary tract symptoms: Secondary | ICD-10-CM | POA: Diagnosis present

## 2019-05-27 DIAGNOSIS — Z96642 Presence of left artificial hip joint: Secondary | ICD-10-CM | POA: Diagnosis present

## 2019-05-27 DIAGNOSIS — C801 Malignant (primary) neoplasm, unspecified: Secondary | ICD-10-CM | POA: Diagnosis present

## 2019-05-27 DIAGNOSIS — R296 Repeated falls: Secondary | ICD-10-CM | POA: Diagnosis present

## 2019-05-27 DIAGNOSIS — R601 Generalized edema: Secondary | ICD-10-CM

## 2019-05-27 DIAGNOSIS — D61818 Other pancytopenia: Secondary | ICD-10-CM | POA: Diagnosis not present

## 2019-05-27 DIAGNOSIS — Z7901 Long term (current) use of anticoagulants: Secondary | ICD-10-CM | POA: Diagnosis not present

## 2019-05-27 DIAGNOSIS — R7989 Other specified abnormal findings of blood chemistry: Secondary | ICD-10-CM

## 2019-05-27 DIAGNOSIS — J90 Pleural effusion, not elsewhere classified: Secondary | ICD-10-CM | POA: Diagnosis present

## 2019-05-27 DIAGNOSIS — I82402 Acute embolism and thrombosis of unspecified deep veins of left lower extremity: Secondary | ICD-10-CM | POA: Diagnosis present

## 2019-05-27 DIAGNOSIS — I361 Nonrheumatic tricuspid (valve) insufficiency: Secondary | ICD-10-CM | POA: Diagnosis not present

## 2019-05-27 DIAGNOSIS — D6959 Other secondary thrombocytopenia: Secondary | ICD-10-CM | POA: Diagnosis present

## 2019-05-27 DIAGNOSIS — R180 Malignant ascites: Secondary | ICD-10-CM | POA: Diagnosis present

## 2019-05-27 DIAGNOSIS — I82442 Acute embolism and thrombosis of left tibial vein: Secondary | ICD-10-CM | POA: Diagnosis present

## 2019-05-27 DIAGNOSIS — F17210 Nicotine dependence, cigarettes, uncomplicated: Secondary | ICD-10-CM | POA: Diagnosis present

## 2019-05-27 DIAGNOSIS — R509 Fever, unspecified: Secondary | ICD-10-CM | POA: Diagnosis not present

## 2019-05-27 DIAGNOSIS — Z66 Do not resuscitate: Secondary | ICD-10-CM | POA: Diagnosis present

## 2019-05-27 DIAGNOSIS — B9620 Unspecified Escherichia coli [E. coli] as the cause of diseases classified elsewhere: Secondary | ICD-10-CM | POA: Diagnosis not present

## 2019-05-27 DIAGNOSIS — G629 Polyneuropathy, unspecified: Secondary | ICD-10-CM | POA: Diagnosis present

## 2019-05-27 DIAGNOSIS — N390 Urinary tract infection, site not specified: Secondary | ICD-10-CM | POA: Diagnosis present

## 2019-05-27 DIAGNOSIS — R32 Unspecified urinary incontinence: Secondary | ICD-10-CM | POA: Diagnosis present

## 2019-05-27 DIAGNOSIS — Z7952 Long term (current) use of systemic steroids: Secondary | ICD-10-CM

## 2019-05-27 DIAGNOSIS — C229 Malignant neoplasm of liver, not specified as primary or secondary: Secondary | ICD-10-CM

## 2019-05-27 DIAGNOSIS — E872 Acidosis: Secondary | ICD-10-CM | POA: Diagnosis not present

## 2019-05-27 DIAGNOSIS — T451X5A Adverse effect of antineoplastic and immunosuppressive drugs, initial encounter: Secondary | ICD-10-CM | POA: Diagnosis present

## 2019-05-27 DIAGNOSIS — R531 Weakness: Secondary | ICD-10-CM | POA: Diagnosis not present

## 2019-05-27 DIAGNOSIS — D61810 Antineoplastic chemotherapy induced pancytopenia: Secondary | ICD-10-CM | POA: Diagnosis present

## 2019-05-27 DIAGNOSIS — C799 Secondary malignant neoplasm of unspecified site: Secondary | ICD-10-CM

## 2019-05-27 DIAGNOSIS — R338 Other retention of urine: Secondary | ICD-10-CM | POA: Diagnosis present

## 2019-05-27 DIAGNOSIS — E860 Dehydration: Secondary | ICD-10-CM | POA: Diagnosis present

## 2019-05-27 DIAGNOSIS — E871 Hypo-osmolality and hyponatremia: Secondary | ICD-10-CM | POA: Diagnosis not present

## 2019-05-27 DIAGNOSIS — N179 Acute kidney failure, unspecified: Secondary | ICD-10-CM | POA: Diagnosis present

## 2019-05-27 DIAGNOSIS — R930 Abnormal findings on diagnostic imaging of skull and head, not elsewhere classified: Secondary | ICD-10-CM | POA: Diagnosis not present

## 2019-05-27 DIAGNOSIS — R0600 Dyspnea, unspecified: Secondary | ICD-10-CM | POA: Diagnosis not present

## 2019-05-27 DIAGNOSIS — R55 Syncope and collapse: Secondary | ICD-10-CM

## 2019-05-27 DIAGNOSIS — R918 Other nonspecific abnormal finding of lung field: Secondary | ICD-10-CM | POA: Diagnosis not present

## 2019-05-27 DIAGNOSIS — Z20828 Contact with and (suspected) exposure to other viral communicable diseases: Secondary | ICD-10-CM | POA: Diagnosis present

## 2019-05-27 DIAGNOSIS — I959 Hypotension, unspecified: Secondary | ICD-10-CM | POA: Diagnosis not present

## 2019-05-27 DIAGNOSIS — H9190 Unspecified hearing loss, unspecified ear: Secondary | ICD-10-CM | POA: Diagnosis present

## 2019-05-27 DIAGNOSIS — I4891 Unspecified atrial fibrillation: Secondary | ICD-10-CM | POA: Diagnosis present

## 2019-05-27 DIAGNOSIS — Z515 Encounter for palliative care: Secondary | ICD-10-CM | POA: Diagnosis present

## 2019-05-27 DIAGNOSIS — Z79891 Long term (current) use of opiate analgesic: Secondary | ICD-10-CM

## 2019-05-27 DIAGNOSIS — Z8619 Personal history of other infectious and parasitic diseases: Secondary | ICD-10-CM

## 2019-05-27 DIAGNOSIS — Z7189 Other specified counseling: Secondary | ICD-10-CM

## 2019-05-27 DIAGNOSIS — I824Z2 Acute embolism and thrombosis of unspecified deep veins of left distal lower extremity: Secondary | ICD-10-CM | POA: Diagnosis not present

## 2019-05-27 LAB — I-STAT CHEM 8, ED
BUN: 32 mg/dL — ABNORMAL HIGH (ref 8–23)
Calcium, Ion: 1.15 mmol/L (ref 1.15–1.40)
Chloride: 106 mmol/L (ref 98–111)
Creatinine, Ser: 1.4 mg/dL — ABNORMAL HIGH (ref 0.61–1.24)
Glucose, Bld: 111 mg/dL — ABNORMAL HIGH (ref 70–99)
HCT: 40 % (ref 39.0–52.0)
Hemoglobin: 13.6 g/dL (ref 13.0–17.0)
Potassium: 4.2 mmol/L (ref 3.5–5.1)
Sodium: 135 mmol/L (ref 135–145)
TCO2: 20 mmol/L — ABNORMAL LOW (ref 22–32)

## 2019-05-27 LAB — URINALYSIS, ROUTINE W REFLEX MICROSCOPIC
Bacteria, UA: NONE SEEN
Bilirubin Urine: NEGATIVE
Glucose, UA: NEGATIVE mg/dL
Ketones, ur: NEGATIVE mg/dL
Leukocytes,Ua: NEGATIVE
Nitrite: NEGATIVE
Protein, ur: NEGATIVE mg/dL
Specific Gravity, Urine: 1.042 — ABNORMAL HIGH (ref 1.005–1.030)
pH: 5 (ref 5.0–8.0)

## 2019-05-27 LAB — CBC WITH DIFFERENTIAL/PLATELET
Abs Immature Granulocytes: 0.43 10*3/uL — ABNORMAL HIGH (ref 0.00–0.07)
Basophils Absolute: 0 10*3/uL (ref 0.0–0.1)
Basophils Relative: 0 %
Eosinophils Absolute: 0 10*3/uL (ref 0.0–0.5)
Eosinophils Relative: 0 %
HCT: 40.7 % (ref 39.0–52.0)
Hemoglobin: 13 g/dL (ref 13.0–17.0)
Immature Granulocytes: 5 %
Lymphocytes Relative: 4 %
Lymphs Abs: 0.4 10*3/uL — ABNORMAL LOW (ref 0.7–4.0)
MCH: 29.3 pg (ref 26.0–34.0)
MCHC: 31.9 g/dL (ref 30.0–36.0)
MCV: 91.7 fL (ref 80.0–100.0)
Monocytes Absolute: 1 10*3/uL (ref 0.1–1.0)
Monocytes Relative: 11 %
Neutro Abs: 7.5 10*3/uL (ref 1.7–7.7)
Neutrophils Relative %: 80 %
Platelets: 194 10*3/uL (ref 150–400)
RBC: 4.44 MIL/uL (ref 4.22–5.81)
RDW: 17.4 % — ABNORMAL HIGH (ref 11.5–15.5)
WBC: 9.4 10*3/uL (ref 4.0–10.5)
nRBC: 0 % (ref 0.0–0.2)

## 2019-05-27 LAB — TROPONIN I (HIGH SENSITIVITY)
Troponin I (High Sensitivity): 166 ng/L (ref <18)
Troponin I (High Sensitivity): 151 ng/L (ref <18)

## 2019-05-27 LAB — ECHOCARDIOGRAM COMPLETE
Height: 73 in
Weight: 2924.89 oz

## 2019-05-27 LAB — HEPATIC FUNCTION PANEL
ALT: 40 U/L (ref 0–44)
AST: 52 U/L — ABNORMAL HIGH (ref 15–41)
Albumin: 2.5 g/dL — ABNORMAL LOW (ref 3.5–5.0)
Alkaline Phosphatase: 268 U/L — ABNORMAL HIGH (ref 38–126)
Bilirubin, Direct: 0.6 mg/dL — ABNORMAL HIGH (ref 0.0–0.2)
Indirect Bilirubin: 0.6 mg/dL (ref 0.3–0.9)
Total Bilirubin: 1.2 mg/dL (ref 0.3–1.2)
Total Protein: 5.2 g/dL — ABNORMAL LOW (ref 6.5–8.1)

## 2019-05-27 LAB — SARS CORONAVIRUS 2 (TAT 6-24 HRS): SARS Coronavirus 2: NEGATIVE

## 2019-05-27 MED ORDER — FOLIC ACID 1 MG PO TABS
1.0000 mg | ORAL_TABLET | Freq: Every day | ORAL | Status: DC
  Administered 2019-05-27 – 2019-06-02 (×7): 1 mg via ORAL
  Filled 2019-05-27 (×7): qty 1

## 2019-05-27 MED ORDER — SODIUM CHLORIDE 0.9 % IV BOLUS
500.0000 mL | Freq: Once | INTRAVENOUS | Status: AC
  Administered 2019-05-27: 500 mL via INTRAVENOUS

## 2019-05-27 MED ORDER — ONDANSETRON HCL 4 MG/2ML IJ SOLN: 4.0000 mg | Freq: Four times a day (QID) | INTRAMUSCULAR | Status: DC | PRN

## 2019-05-27 MED ORDER — APIXABAN (ELIQUIS) VTE STARTER PACK (10MG AND 5MG): 5.0000 mg | ORAL_TABLET | Freq: Two times a day (BID) | ORAL | Status: DC

## 2019-05-27 MED ORDER — VITAMIN B-12 1000 MCG PO TABS
1000.0000 ug | ORAL_TABLET | Freq: Every day | ORAL | Status: DC
  Administered 2019-05-27 – 2019-06-02 (×7): 1000 ug via ORAL
  Filled 2019-05-27 (×8): qty 1

## 2019-05-27 MED ORDER — CHLORHEXIDINE GLUCONATE CLOTH 2 % EX PADS
6.0000 | MEDICATED_PAD | Freq: Every day | CUTANEOUS | Status: DC
  Administered 2019-05-28 – 2019-06-02 (×5): 6 via TOPICAL

## 2019-05-27 MED ORDER — IOHEXOL 350 MG/ML SOLN
100.0000 mL | Freq: Once | INTRAVENOUS | Status: AC | PRN
  Administered 2019-05-27: 100 mL via INTRAVENOUS

## 2019-05-27 MED ORDER — SENNOSIDES-DOCUSATE SODIUM 8.6-50 MG PO TABS: 1.0000 | ORAL_TABLET | Freq: Every evening | ORAL | Status: DC | PRN

## 2019-05-27 MED ORDER — GABAPENTIN 300 MG PO CAPS
600.0000 mg | ORAL_CAPSULE | Freq: Two times a day (BID) | ORAL | Status: DC
  Administered 2019-05-27 – 2019-06-02 (×13): 600 mg via ORAL
  Filled 2019-05-27 (×13): qty 2

## 2019-05-27 MED ORDER — ONDANSETRON HCL 4 MG PO TABS: 4.0000 mg | ORAL_TABLET | Freq: Four times a day (QID) | ORAL | Status: DC | PRN

## 2019-05-27 MED ORDER — HYDROCODONE-ACETAMINOPHEN 5-325 MG PO TABS
1.0000 | ORAL_TABLET | Freq: Four times a day (QID) | ORAL | Status: DC | PRN
  Administered 2019-05-27 – 2019-06-02 (×5): 2 via ORAL
  Filled 2019-05-27 (×5): qty 2

## 2019-05-27 MED ORDER — APIXABAN 5 MG PO TABS
5.0000 mg | ORAL_TABLET | Freq: Two times a day (BID) | ORAL | Status: DC
  Administered 2019-05-27 – 2019-06-01 (×11): 5 mg via ORAL
  Filled 2019-05-27 (×13): qty 1

## 2019-05-27 MED ORDER — FLUTICASONE PROPIONATE 50 MCG/ACT NA SUSP
2.0000 | Freq: Every day | NASAL | Status: DC
  Administered 2019-05-27 – 2019-06-02 (×7): 2 via NASAL
  Filled 2019-05-27: qty 16

## 2019-05-27 MED ORDER — TOPIRAMATE 25 MG PO TABS
50.0000 mg | ORAL_TABLET | Freq: Every day | ORAL | Status: DC
  Administered 2019-05-27 – 2019-06-01 (×6): 50 mg via ORAL
  Filled 2019-05-27 (×7): qty 2

## 2019-05-27 MED ORDER — FINASTERIDE 5 MG PO TABS
5.0000 mg | ORAL_TABLET | Freq: Every day | ORAL | Status: DC
  Administered 2019-05-27 – 2019-06-02 (×7): 5 mg via ORAL
  Filled 2019-05-27 (×8): qty 1

## 2019-05-27 MED ORDER — VITAMIN B-1 100 MG PO TABS
100.0000 mg | ORAL_TABLET | Freq: Every day | ORAL | Status: DC
  Administered 2019-05-27 – 2019-06-02 (×7): 100 mg via ORAL
  Filled 2019-05-27 (×7): qty 1

## 2019-05-27 MED ORDER — SODIUM CHLORIDE (PF) 0.9 % IJ SOLN
INTRAMUSCULAR | Status: AC
  Administered 2019-05-27: 04:00:00
  Filled 2019-05-27: qty 50

## 2019-05-27 MED ORDER — GLUCOSAMINE-CHONDROITIN PO CAPS: 1.0000 | ORAL_CAPSULE | Freq: Every day | ORAL | Status: DC

## 2019-05-27 MED ORDER — LORATADINE 10 MG PO TABS
10.0000 mg | ORAL_TABLET | Freq: Every day | ORAL | Status: DC
  Administered 2019-05-27 – 2019-06-02 (×7): 10 mg via ORAL
  Filled 2019-05-27 (×7): qty 1

## 2019-05-27 MED ORDER — ENSURE ENLIVE PO LIQD
237.0000 mL | Freq: Two times a day (BID) | ORAL | Status: DC
  Administered 2019-05-27 – 2019-06-01 (×7): 237 mL via ORAL

## 2019-05-27 NOTE — Progress Notes (Signed)
  Echocardiogram 2D Echocardiogram has been performed.  Burnett Kanaris 05/27/2019, 2:33 PM

## 2019-05-27 NOTE — Consult Note (Signed)
Cardiology Consultation:   Patient ID: Darius Ellis MRN: [id number]; DOB: 11/10/1940  Admit date: 05/27/2019 Date of Consult: 05/27/2019  Primary Care Provider: Libby Maw, MD Primary Cardiologist: No primary care provider on file.    Patient Profile:   Darius Ellis is a 78 y.o. 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    History of Present Illness:   Darius Ellis has no known heart disease.  He was seen by a cardiologist in [place].  Recalls a remote stress test that was normal.  Has been worsening over recent weeks.  Increasing fatigue, dyspnea on exertion, lightheadedness with standing.  He has had 2 falls in the last week, both shortly after standing.   Noted in ER to be hypotensive 120-150s>>80-100 sys/ Bun Cr 32/1.4 ( about consistent)   P.o. intake is apparently not too bad, although he does complain of early satiety.  Dyspnea on exertion at less than 10-15 feet.  Improved following initial paracentesis but not with the most recent one.  Chest CT on admission demonstrated no PE small effusions  Has a history of progressive chest pains over the last 3-4 weeks.  He describes this as soreness across his mid chest.  It is without radiation nausea diaphoresis or shortness of breath.  It is unrelated to exertion.  Significant bilateral edema.  Has an albumin of 2.5.   Past Medical History:  Diagnosis Date  . A-fib ([place])   . H. pylori infection    Treated and healed  . Hypertension   . 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 PTSD (post-traumatic stress disorder) 2006    Past Surgical  History:  Procedure Laterality Date  . BLADDER SURGERY     Stones  . Left Hip Replacement Left   . radioactive embolization     Liver     No current facility-administered medications on file prior to encounter.    Current Outpatient Medications on File Prior to Encounter  Medication Sig Dispense Refill  . Apixaban Starter Pack (ELIQUIS STARTER PACK) 5 MG TBPK Take as directed on package: start with two-5mg  tablets twice daily for 7 days. On day 8, switch to one-5mg  tablet twice daily. 1 each 0  . cetirizine (ZYRTEC) 10 MG tablet Take 10 mg by mouth daily.    . finasteride (PROSCAR) 5 MG tablet Take 5 mg by mouth daily.    Marland Kitchen gabapentin (NEURONTIN) 300 MG capsule Take 600 mg by mouth 2 (two) times daily.     Marland Kitchen GLUCOSAMINE-CHONDROITIN PO Take 1 tablet by mouth daily. Glucosamine 1500 mg/chondroitin 1200 mg     . HYDROcodone-acetaminophen (NORCO) 5-325 MG tablet Take 1-2 tablets by mouth every 6 (six) hours as needed for moderate pain. 40 tablet 0  . naproxen sodium (ALEVE) 220 MG tablet Take 220 mg by mouth 2 (two) times daily as needed (pain).    . ondansetron (ZOFRAN) 8 MG tablet Take 8 mg by mouth every 8 (eight) hours as needed for nausea or vomiting.     . predniSONE (STERAPRED UNI-PAK 48 TAB) 10 MG (48) TBPK tablet Pharm to instruct a 12  day dose pack. 48 tablet 0  . tamsulosin (FLOMAX) 0.4 MG CAPS capsule Take 0.4 mg by mouth.    . vitamin B-12 (CYANOCOBALAMIN) 1000 MCG tablet Take 1,000 mcg by mouth daily.    . fluticasone (FLONASE) 50 MCG/ACT nasal spray Place 2 sprays into both nostrils daily. 16 g 6  . prochlorperazine (COMPAZINE) 10 MG tablet Take 10 mg by mouth every 6 (six) hours as needed for nausea or vomiting.    . topiramate (TOPAMAX) 25 MG tablet Take 50 mg by mouth at bedtime.        Inpatient Medications: Scheduled Meds: . apixaban  5 mg Oral BID  . finasteride  5 mg Oral Daily  . fluticasone  2 spray Each Nare Daily  . folic acid  1 mg Oral Daily  . gabapentin   600 mg Oral BID  . loratadine  10 mg Oral Daily  . thiamine  100 mg Oral Daily  . topiramate  50 mg Oral QHS  . vitamin B-12  1,000 mcg Oral Daily   Continuous Infusions:  PRN Meds: HYDROcodone-acetaminophen, ondansetron **OR** ondansetron (ZOFRAN) IV, senna-docusate  Allergies:   No Known Allergies  Social History:   Social History   Socioeconomic History  . Marital status: Married    Spouse name: Not on file  . Number of children: Not on file  . Years of education: Not on file  . Highest education level: Not on file  Occupational History  . Not on file  Social Needs  . Financial resource strain: Not on file  . Food insecurity    Worry: Not on file    Inability: Not on file  . Transportation needs    Medical: Not on file    Non-medical: Not on file  Tobacco Use  . Smoking status: Current Every Day Smoker    Types: Pipe  . Smokeless tobacco: Current User  Substance and Sexual Activity  . Alcohol use: Not Currently  . Drug use: Never  . Sexual activity: Not on file  Lifestyle  . Physical activity    Days per week: Not on file    Minutes per session: Not on file  . Stress: Not on file  Relationships  . Social Herbalist on phone: Not on file    Gets together: Not on file    Attends religious service: Not on file    Active member of club or organization: Not on file    Attends meetings of clubs or organizations: Not on file    Relationship status: Not on file  . Intimate partner violence    Fear of current or ex partner: Not on file    Emotionally abused: Not on file    Physically abused: Not on file    Forced sexual activity: Not on file  Other Topics Concern  . Not on file  Social History Narrative  . Not on file    Family History:     Family History  Problem Relation Age of Onset  . Polycystic kidney disease Mother      ROS:  Please see the history of present illness.    All other ROS reviewed and negative.     Physical Exam/Data:    Vitals:   05/27/19 0630 05/27/19 0700 05/27/19 0730 05/27/19 0800  BP: 99/66 102/64 95/65 102/65  Pulse: 78 73 72 72  Resp: 20     Temp:      TempSrc:      SpO2:  97% 98% 97% 98%  Weight:      Height:        Intake/Output Summary (Last 24 hours) at 05/27/2019 0838 Last data filed at 05/27/2019 0718 Gross per 24 hour  Intake 1000 ml  Output -  Net 1000 ml   Last 3 Weights 05/27/2019 05/26/2019 05/15/2019  Weight (lbs) 182 lb 12.9 oz 182 lb 12.8 oz 192 lb  Weight (kg) 82.92 kg 82.918 kg 87.091 kg     Body mass index is 24.12 kg/m.  General:  Well nourished, well developed, mildly uncomfortable lying on his right side .  HEENT: normal Lymph: no adenopathy Neck: Unable to discern JVD Endocrine:  No thryomegaly Vascular: No carotid bruits; FA pulses 2+ bilaterally without bruits  Cardiac:  normal S1, S2; RRR;   2/6 systolic murmur Lungs:  clear to auscultation bilaterally, no wheezing, rhonchi or rales  Abd: soft   Ext: 2-3+ Musculoskeletal:  No deformities, BUE and BLE strength normal and equal Skin: warm and dry  Neuro:  CNs 2-12 intact, no focal abnormalities noted Psych:  Normal affect   EKG:  The EKG was personally reviewed and demonstrates: Sinus at 89 Intervals 14/08/40 Otherwise normal Telemetry:  Telemetry was personally reviewed and demonstrates: Occasional PVCs  Relevant CV Studies:    Laboratory Data:  High Sensitivity Troponin:   Recent Labs  Lab 05/27/19 0155 05/27/19 0330  TROPONINIHS 166* 151*     Chemistry Recent Labs  Lab 05/26/19 0840 05/27/19 0205  NA 137 135  K 3.9 4.2  CL 107 106  CO2 22  --   GLUCOSE 105* 111*  BUN 29* 32*  CREATININE 1.10 1.40*  CALCIUM 8.6*  --   GFRNONAA >60  --   GFRAA >60  --   ANIONGAP 8  --     Recent Labs  Lab 05/26/19 0840 05/27/19 0155  PROT 5.3* 5.2*  ALBUMIN 2.5* 2.5*  AST 37 52*  ALT 34 40  ALKPHOS 297* 268*  BILITOT 0.8 1.2   Hematology Recent Labs  Lab 05/26/19 0840 05/27/19 0155  05/27/19 0205  WBC 5.8 9.4  --   RBC 4.75 4.44  --   HGB 13.9 13.0 13.6  HCT 43.5 40.7 40.0  MCV 91.6 91.7  --   MCH 29.3 29.3  --   MCHC 32.0 31.9  --   RDW 17.2* 17.4*  --   PLT 218 194  --    BNPNo results for input(s): BNP, PROBNP in the last 168 hours.  DDimer No results for input(s): DDIMER in the last 168 hours.   Radiology/Studies:  Ct Head Wo Contrast  Result Date: 05/27/2019 CLINICAL DATA:  Lightheadedness.  Syncope. EXAM: CT HEAD WITHOUT CONTRAST TECHNIQUE: Contiguous axial images were obtained from the base of the skull through the vertex without intravenous contrast. COMPARISON:  April 10, 2019 FINDINGS: Brain: No subdural, epidural, or subarachnoid hemorrhage. Cerebellum, brainstem, and basal sys cisterns are normal. 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 No acute cortical ischemia infarct noted. No mass effect or midline shift. Ventricles and sulci are stable. Vascular: No hyperdense vessel or unexpected calcification. Skull: Normal. Negative for fracture or focal lesion. Sinuses/Orbits: No acute finding. Other: None. IMPRESSION: No acute intracranial abnormalities. Electronically Signed   By: Dorise Bullion III M.D   On: 05/27/2019 03:17   Ct Angio Chest Pe W And/or Wo Contrast  Result Date: 05/27/2019 CLINICAL DATA:  Dizziness and syncope EXAM: CT ANGIOGRAPHY CHEST WITH CONTRAST TECHNIQUE: Multidetector CT  imaging of the chest was performed using the standard protocol during bolus administration of intravenous contrast. Multiplanar CT image reconstructions and MIPs were obtained to evaluate the vascular anatomy. CONTRAST:  147mL OMNIPAQUE IOHEXOL 350 MG/ML SOLN COMPARISON:  May 02, 2019 FINDINGS: Cardiovascular: There is a optimal opacification of the pulmonary arteries. There is no central,segmental, or subsegmental filling defects within the pulmonary arteries. The heart is normal in size. No pericardial effusion or thickening. No evidence right heart  strain. There is normal three-vessel brachiocephalic anatomy without proximal stenosis. Scattered aortic atherosclerotic calcifications are seen without aneurysmal dilatation. Mediastinum/Nodes: No hilar, mediastinal, or axillary adenopathy. Again noted is a 1.4 cm low-density lesion within the left thyroid lobe. Lungs/Pleura: There are small bilateral pleural effusions, left greater right. Patchy/streaky airspace opacity seen at both lung bases which could be compressive atelectasis. Upper Abdomen: There is partially visualized multiple hypodense lesions seen throughout the liver. A moderate amount of abdominal ascites is seen. For Musculoskeletal: No chest wall abnormality. No acute or significant osseous findings. Anterior flowing osteophytes seen throughout the thoracic spine. Review of the MIP images confirms the above findings. IMPRESSION: 1. no central, segmental, or subsegmental pulmonary embolism. 2. Small bilateral pleural effusions, left greater than right with probable adjacent compressive atelectasis. 3. Partially visualized multiple hypodense liver lesions. 4. Moderate abdominal ascites. Electronically Signed   By: Prudencio Pair M.D.   On: 05/27/2019 03:40   Dg Chest Portable 1 View  Result Date: 05/27/2019 CLINICAL DATA:  Dizziness and syncope EXAM: PORTABLE CHEST 1 VIEW COMPARISON:  None. FINDINGS: There is mild cardiomegaly. A left-sided MediPort catheter is seen with the tip at the lower SVC. There is moderate left and small right pleural effusion. Hazy airspace opacity seen adjacent to lower lungs which is likely layering effusion and/or compressive atelectasis. No acute osseous abnormality. IMPRESSION: Moderate left and small right pleural effusion with probable adjacent layering effusion and/or atelectasis. Electronically Signed   By: Prudencio Pair M.D.   On: 05/27/2019 01:00    Assessment and Plan:   1. Falls-recurred probably orthostatic 2. Dyspnea on exertion-worsening 3. Metastatic  adenocarcinoma of unknown source with liver mets amongst others 4. DVT on apixaban 5. Elevated troponin  The patient has recurrent falls likely orthostatic in the context of low blood pressure.  To questions are begged, the first is is any of his chemotherapeutic regime contributing and or does his DVT extending to his IVC that might be further impairing venous return.  The second are strategies to address which would include likely vasoactive agents i.e. ProAmatine and/or Florinef, the latter being more problematic because of its potential for worsening fluid retention.    ?  Could his Flomax be stopped? We will try to measure orthostatic vital signs  The other issue is his dyspnea on exertion.  We will check an echocardiogram.  I do not think his elevated troponins, particular with the trajectory being flat, represent an acute coronary syndrome contributing to his symptoms.  Continue apixaban for his DVT  We will follow with you     For questions or updates, please contact [employer] Please consult [email or web address] for contact info under     Signed, Virl Axe, MD  05/27/2019 8:38 AM

## 2019-05-27 NOTE — ED Provider Notes (Addendum)
[facility] DEPT Provider Note   CSN: [id number] Arrival date & time: 05/27/19  0020     History   Chief Complaint Chief Complaint  Patient presents with   Weakness   Dizziness   Near Syncope    HPI Khalen Betton is a 78 y.o. male.     The history is provided by the patient and the EMS personnel.  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  Patient is also on an extended prednisone taper but has missed some of his afternoon doses.  No f/c/r. No covid contacts.  No n/v/d.    Past Medical History:  Diagnosis Date   A-fib ([place])    H. 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 Authorizing  Provider  Apixaban Starter Pack (ELIQUIS STARTER PACK) 5 MG TBPK Take as directed on package: start with two-5mg  tablets twice daily for 7 days. On day 8, switch to one-5mg  tablet twice daily. 05/12/19  Yes Owens Shark, NP  cetirizine (ZYRTEC) 10 MG tablet Take 10 mg by mouth daily.   Yes [provider]  finasteride (PROSCAR) 5 MG tablet Take 5 mg by mouth daily.   Yes [provider]  gabapentin (NEURONTIN) 300 MG capsule Take 600 mg by mouth 2 (two) times daily.    Yes [provider]  GLUCOSAMINE-CHONDROITIN PO Take 1 tablet by mouth daily. Glucosamine 1500 mg/chondroitin 1200 mg    Yes [provider]  HYDROcodone-acetaminophen (NORCO) 5-325 MG tablet Take 1-2 tablets by mouth every 6 (six) hours as needed for moderate pain. 04/21/19  Yes Owens Shark, NP  naproxen sodium (ALEVE) 220 MG tablet Take 220 mg by mouth 2 (two) times daily as needed (pain).   Yes [provider]  ondansetron (ZOFRAN) 8 MG tablet Take 8 mg by mouth every 8 (eight) hours as needed for nausea or vomiting.    Yes [provider]  predniSONE (STERAPRED UNI-PAK 48 TAB) 10 MG (48) TBPK tablet Pharm to instruct a 12 day dose pack. 05/15/19  Yes Libby Maw, MD  tamsulosin (FLOMAX) 0.4 MG CAPS capsule Take 0.4 mg by mouth.   Yes [provider]  vitamin B-12 (  CYANOCOBALAMIN) 1000 MCG tablet Take 1,000 mcg by mouth daily.   Yes [provider]  fluticasone (FLONASE) 50 MCG/ACT nasal spray Place 2 sprays into both nostrils daily. 05/01/19   Libby Maw, MD  prochlorperazine (COMPAZINE) 10 MG tablet Take 10 mg by mouth every 6 (six) hours as needed for nausea or vomiting.    [provider]  topiramate (TOPAMAX) 25 MG tablet Take 50 mg by mouth at bedtime.     [provider]    Family History Family History  Problem Relation Age of Onset   Polycystic kidney disease Mother     Social History Social History     Tobacco Use   Smoking status: Current Every Day Smoker    Types: Pipe   Smokeless tobacco: Current User  Substance Use Topics   Alcohol use: Not Currently   Drug use: Never     Allergies   Patient has no known allergies.   Review of Systems Review of Systems  Constitutional: Positive for fatigue. Negative for diaphoresis and fever.  HENT: Negative for congestion.   Eyes: Negative for visual disturbance.  Respiratory: Negative for shortness of breath.   Cardiovascular: Positive for syncope. Negative for chest pain.  Gastrointestinal: Negative for diarrhea, nausea and vomiting.  Genitourinary: Negative for difficulty urinating.  Musculoskeletal: Negative for arthralgias.  Neurological: Positive for syncope and light-headedness. Negative for focal weakness and facial asymmetry.  Psychiatric/Behavioral: Negative for agitation and confusion.  All other systems reviewed and are negative.    Physical Exam Updated Vital Signs BP (!) 91/59    Pulse 80    Temp 97.8 F (36.6 C) (Oral)    Resp (!) 21    Ht 6\' 1"  (1.854 m)    Wt 82.9 kg    SpO2 99%    BMI 24.12 kg/m   Physical Exam Vitals signs and nursing note reviewed.  Constitutional:      General: He is not in acute distress.    Appearance: Normal appearance.  HENT:     Head: Normocephalic and atraumatic.     Nose: Nose normal.  Eyes:     Conjunctiva/sclera: Conjunctivae normal.     Pupils: Pupils are equal, round, and reactive to light.  Neck:     Musculoskeletal: Normal range of motion and neck supple.  Cardiovascular:     Rate and Rhythm: Normal rate and regular rhythm.     Pulses: Normal pulses.     Heart sounds: Normal heart sounds.  Pulmonary:     Breath sounds: Examination of the right-lower field reveals decreased breath sounds. Examination of the left-lower field reveals decreased breath sounds. Decreased breath sounds present. No wheezing or rales.  Abdominal:     General: Abdomen is flat. Bowel sounds  are normal.     Tenderness: There is no abdominal tenderness. There is no guarding or rebound.  Musculoskeletal: Normal range of motion.  Skin:    General: Skin is warm and dry.     Capillary Refill: Capillary refill takes less than 2 seconds.  Neurological:     General: No focal deficit present.     Mental Status: He is alert and oriented to person, place, and time.     Deep Tendon Reflexes: Reflexes normal.  Psychiatric:        Behavior: Behavior normal.      ED Treatments / Results  Labs (all labs ordered are listed, but only abnormal results are displayed) Labs Reviewed  CBC WITH DIFFERENTIAL/PLATELET - Abnormal;  Notable for the following components:      Result Value   RDW 17.4 (*)    Lymphs Abs 0.4 (*)    Abs Immature Granulocytes 0.43 (*)    All other components within normal limits  HEPATIC FUNCTION PANEL - Abnormal; Notable for the following components:   Total Protein 5.2 (*)    Albumin 2.5 (*)    AST 52 (*)    Alkaline Phosphatase 268 (*)    Bilirubin, Direct 0.6 (*)    All other components within normal limits  I-STAT CHEM 8, ED - Abnormal; Notable for the following components:   BUN 32 (*)    Creatinine, Ser 1.40 (*)    Glucose, Bld 111 (*)    TCO2 20 (*)    All other components within normal limits  TROPONIN I (HIGH SENSITIVITY) - Abnormal; Notable for the following components:   Troponin I (High Sensitivity) 166 (*)    All other components within normal limits  URINALYSIS, ROUTINE W REFLEX MICROSCOPIC  TROPONIN I (HIGH SENSITIVITY)    EKG EKG Interpretation  Date/Time:  Saturday May 27 2019 00:52:40 EST Ventricular Rate:  89 PR Interval:    QRS Duration: 88 QT Interval:  366 QTC Calculation: 446 R Axis:   -11 Text Interpretation: Sinus rhythm Confirmed by Dory Horn) on 05/27/2019 2:37:43 AM   Radiology Ct Head Wo Contrast  Result Date: 05/27/2019 CLINICAL DATA:  Lightheadedness.  Syncope. EXAM: CT HEAD WITHOUT CONTRAST  TECHNIQUE: Contiguous axial images were obtained from the base of the skull through the vertex without intravenous contrast. COMPARISON:  April 10, 2019 FINDINGS: Brain: No subdural, epidural, or subarachnoid hemorrhage. Cerebellum, brainstem, and basal sys cisterns are normal. 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 No acute cortical ischemia infarct noted. No mass effect or midline shift. Ventricles and sulci are stable. Vascular: No hyperdense vessel or unexpected calcification. Skull: Normal. Negative for fracture or focal lesion. Sinuses/Orbits: No acute finding. Other: None. IMPRESSION: No acute intracranial abnormalities. Electronically Signed   By: Dorise Bullion III M.D   On: 05/27/2019 03:17   Ct Angio Chest Pe W And/or Wo Contrast  Result Date: 05/27/2019 CLINICAL DATA:  Dizziness and syncope EXAM: CT ANGIOGRAPHY CHEST WITH CONTRAST TECHNIQUE: Multidetector CT imaging of the chest was performed using the standard protocol during bolus administration of intravenous contrast. Multiplanar CT image reconstructions and MIPs were obtained to evaluate the vascular anatomy. CONTRAST:  178mL OMNIPAQUE IOHEXOL 350 MG/ML SOLN COMPARISON:  May 02, 2019 FINDINGS: Cardiovascular: There is a optimal opacification of the pulmonary arteries. There is no central,segmental, or subsegmental filling defects within the pulmonary arteries. The heart is normal in size. No pericardial effusion or thickening. No evidence right heart strain. There is normal three-vessel brachiocephalic anatomy without proximal stenosis. Scattered aortic atherosclerotic calcifications are seen without aneurysmal dilatation. Mediastinum/Nodes: No hilar, mediastinal, or axillary adenopathy. Again noted is a 1.4 cm low-density lesion within the left thyroid lobe. Lungs/Pleura: There are small bilateral pleural effusions, left greater right. Patchy/streaky airspace opacity seen at both lung bases which could be compressive  atelectasis. Upper Abdomen: There is partially visualized multiple hypodense lesions seen throughout the liver. A moderate amount of abdominal ascites is seen. For Musculoskeletal: No chest wall abnormality. No acute or significant osseous findings. Anterior flowing osteophytes seen throughout the thoracic spine. Review of the MIP images confirms the above findings. IMPRESSION: 1. no central, segmental, or subsegmental pulmonary embolism. 2. Small bilateral pleural effusions, left greater than right  with probable adjacent compressive atelectasis. 3. Partially visualized multiple hypodense liver lesions. 4. Moderate abdominal ascites. Electronically Signed   By: Prudencio Pair M.D.   On: 05/27/2019 03:40   Dg Chest Portable 1 View  Result Date: 05/27/2019 CLINICAL DATA:  Dizziness and syncope EXAM: PORTABLE CHEST 1 VIEW COMPARISON:  None. FINDINGS: There is mild cardiomegaly. A left-sided MediPort catheter is seen with the tip at the lower SVC. There is moderate left and small right pleural effusion. Hazy airspace opacity seen adjacent to lower lungs which is likely layering effusion and/or compressive atelectasis. No acute osseous abnormality. IMPRESSION: Moderate left and small right pleural effusion with probable adjacent layering effusion and/or atelectasis. Electronically Signed   By: Prudencio Pair M.D.   On: 05/27/2019 01:00    Procedures Procedures (including critical care time)  Medications Ordered in ED Medications  sodium chloride 0.9 % bolus 500 mL (500 mLs Intravenous New Bag/Given 05/27/19 0334)  sodium chloride (PF) 0.9 % injection (  Given by Other 05/27/19 0401)  iohexol (OMNIPAQUE) 350 MG/ML injection 100 mL (100 mLs Intravenous Contrast Given 05/27/19 0307)    Case d/w Nicole [facility] cardiology fellow regarding troponin.  He feels this is likely some degree of cardio toxicity for the patient's current chemotherapy regimen.  Continue to cycle troponins, cardiology will see patient in am in formal  consult.  Information take.     There are no PEs on CT scan, no head trauma.  I suspect dehydration and possibly some adrenal insufficiency from chemo therapy.  Will admit to medicine for further evaluation and treatment Initial Impression / Assessment and Plan / ED Course   Acxel Tumminia was evaluated in Emergency Department on 05/27/2019 for the symptoms described in the history of present illness. He was evaluated in the context of the global COVID-19 pandemic, which necessitated consideration that the patient might be at risk for infection with the SARS-CoV-2 virus that causes COVID-19. Institutional protocols and algorithms that pertain to the evaluation of patients at risk for COVID-19 are in a state of rapid change based on information released by regulatory bodies including the CDC and federal and state organizations. These policies and algorithms were followed during the patient's care in the ED.   Final Clinical Impressions(s) / ED Diagnoses   Syncope Ascites Liver cancer Dehydration Elevated troponin   Layten Aiken, MD 05/27/19 [place], Thoams Siefert, MD 05/27/19 [id number]

## 2019-05-27 NOTE — ED Notes (Signed)
Urinal at bedside. Pt advised to use call bell for assistance w/collecting U/A as needed. [employer]

## 2019-05-27 NOTE — ED Notes (Signed)
He has been seen by Dr. Caryl Comes (Card.). Also, our ultrasonographer is here to perform abd. U/s. His wife is at bedside; and he continues to be in no distress.

## 2019-05-27 NOTE — ED Triage Notes (Signed)
Per EMS, pt c/o had chemo this morning, typically c/o dizziness, syncope, and generalized weakness. Pt stating he received chemo (hx of liver cancer) this morning and usually has similar symptoms after therapy.  No acute changes in mental status. A&ox4. Med port left chest.

## 2019-05-27 NOTE — ED Notes (Signed)
Date and time results received: 05/27/19 0242 (use smartphrase ".now" to insert current time)  Test: Trop Critical Value: 166 Name of Provider Notified: Randal Buba, MD  Orders Received? Or Actions Taken?: Orders Received - See Orders for details

## 2019-05-27 NOTE — Progress Notes (Signed)
PHARMACIST - PHYSICIAN ORDER COMMUNICATION  CONCERNING: P&T Medication Policy on Herbal Medications  DESCRIPTION:  This patient's order for:  Glucosamine-chondroitin  has been noted.  This product(s) is classified as an "herbal" or natural product. Due to a lack of definitive safety studies or FDA approval, nonstandard manufacturing practices, plus the potential risk of unknown drug-drug interactions while on inpatient medications, the Pharmacy and Therapeutics Committee does not permit the use of "herbal" or natural products of this type within [facility].   ACTION TAKEN: The pharmacy department is unable to verify this order at this time and your patient has been informed of this safety policy. Please reevaluate patient's clinical condition at discharge and address if the herbal or natural product(s) should be resumed at that time.   

## 2019-05-27 NOTE — H&P (Signed)
History and Physical    Darius Ellis [id number] DOB: 1940-11-12 DOA: 05/27/2019  PCP:  I have personally briefly reviewed patient's cultures from the emergency room  Chief Complaint: Syncopal episode  HPI: Darius Ellis is a 78 y.o. male with medical history significant of metastatic liver cancer who has received recent chemotherapy fell in the bathroom.  He also has a history of atrial fibrillation and hypertension.  He had at 1 point suffered from PTSD.  When his daughter attended to him he was awake.  He did have incontinence of urine.  Daughter did not see any convulsive activity. Patient has not had any major complaints such as headache nausea or vomiting.Marland Kitchen He denies any chest pain. He has some shortness of breath at rest.. He has no abdominal pain.  Does have some diarrhea when he takes nutritional supplements.  Appetite has been fair.  He has had 2 abdominal paracentesis and each time his appetite improves afterwards. According to his daughter who is a Marine scientist he has been dwindling for the past several days. ED Course: Patient was evaluated.  Cardiology was consulted because of his elevated troponins.  It was suggested that the chemotherapy may have caused the cardiotoxicity.  At this point there is not much to do he is already on Eliquis.    Review of Systems: As per HPI otherwise 10 point review of systems negative.   Past Medical History:  Diagnosis Date  . A-fib ([place])   . H. pylori infection    Treated and healed  . Hypertension   . 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 PTSD (post-traumatic stress disorder) 2006    Past Surgical History:  Procedure Laterality Date  . BLADDER SURGERY     Stones  . Left Hip Replacement Left   . radioactive embolization     Liver     reports that he has been smoking pipe. He uses smokeless tobacco. He reports previous  alcohol use. He reports that he does not use drugs.  No Known Allergies  Family History  Problem Relation Age of Onset  . Polycystic kidney disease Mother     Prior to Admission medications   Medication Sig Start Date End Date Taking? Authorizing Provider  Apixaban Starter Pack (ELIQUIS STARTER PACK) 5 MG TBPK Take as directed on package: start with two-5mg  tablets twice daily for 7 days. On day 8, switch to one-5mg  tablet twice daily. 05/12/19  Yes Owens Shark, NP  cetirizine (ZYRTEC) 10 MG tablet Take 10 mg by mouth daily.   Yes [provider]  finasteride (PROSCAR) 5 MG tablet Take 5 mg by mouth daily.   Yes [provider]  gabapentin (NEURONTIN) 300 MG capsule Take 600 mg by mouth 2 (two) times daily.    Yes [provider]  GLUCOSAMINE-CHONDROITIN PO Take 1 tablet by mouth daily. Glucosamine 1500 mg/chondroitin 1200 mg    Yes [provider]  HYDROcodone-acetaminophen (NORCO) 5-325 MG tablet Take 1-2 tablets by mouth every 6 (six) hours as needed for moderate pain. 04/21/19  Yes Owens Shark, NP  naproxen sodium (ALEVE) 220 MG tablet Take 220 mg by mouth 2 (two) times daily as needed (pain).   Yes [provider]  ondansetron (ZOFRAN) 8 MG tablet Take 8 mg by mouth every 8 (eight) hours as needed for nausea or vomiting.    Yes [provider]  predniSONE [facility]  UNI-PAK 48 TAB) 10 MG (48) TBPK tablet Pharm to instruct a 12 day dose pack. 05/15/19  Yes Libby Maw, MD  tamsulosin (FLOMAX) 0.4 MG CAPS capsule Take 0.4 mg by mouth.   Yes [provider]  vitamin B-12 (CYANOCOBALAMIN) 1000 MCG tablet Take 1,000 mcg by mouth daily.   Yes [provider]  fluticasone (FLONASE) 50 MCG/ACT nasal spray Place 2 sprays into both nostrils daily. 05/01/19   Libby Maw, MD  prochlorperazine (COMPAZINE) 10 MG tablet Take 10 mg by mouth every 6 (six) hours as needed for nausea or vomiting.    [provider]  topiramate (TOPAMAX) 25 MG tablet Take 50 mg by mouth at bedtime.     [provider]    Physical Exam: Vitals:   05/27/19 0500 05/27/19 0530 05/27/19 0600 05/27/19 0630  BP: 101/69 90/62 95/81  99/66  Pulse: 78 70 78 78  Resp:  20  20  Temp:      TempSrc:      SpO2: 96% 95% 97% 97%  Weight:      Height:        Constitutional: NAD, calm, comfortable Vitals:   05/27/19 0500 05/27/19 0530 05/27/19 0600 05/27/19 0630  BP: 101/69 90/62 95/81  99/66  Pulse: 78 70 78 78  Resp:  20  20  Temp:      TempSrc:      SpO2: 96% 95% 97% 97%  Weight:      Height:       Eyes: PERRL, lids and conjunctivae normal ENMT: Mucous membranes are moist. Posterior pharynx clear of any exudate or lesions.Normal dentition.  Neck: normal, supple, no masses, no thyromegaly Respiratory: clear to auscultation bilaterally, no wheezing, no crackles. Normal respiratory effort. No accessory muscle use.  Cardiovascular: Regular rate and rhythm, no murmurs / rubs / gallops. No extremity edema. 2+ pedal pulses. No carotid bruits.  Abdomen: no tenderness, no masses palpated. No hepatosplenomegaly. Bowel sounds positive.  Musculoskeletal: no clubbing / cyanosis. No joint deformity upper and lower extremities. Good ROM, no contractures. Normal muscle tone.  Skin: no rashes, lesions, ulcers. No induration Neurologic: CN 2-12 grossly intact. Sensation intact, DTR normal. Strength 5/5 in all 4.  Psychiatric: Normal judgment and insight. Alert and oriented x 3. Normal mood.   (Labs on Admission: I have personally reviewed following labs and imaging studies White count is normal hemoglobin is normal sodium is normal potassium is 3.9 normal BUN is 32 creatinine 1.40 GFR is 49 alkaline phosphatase is elevated at 268 albumin is low at 2.5  CBC: Recent Labs  Lab 05/26/19 0840 05/27/19 0155 05/27/19 0205  WBC 5.8 9.4  --   NEUTROABS 3.5 7.5  --   HGB 13.9 13.0 13.6  HCT 43.5 40.7 40.0  MCV  91.6 91.7  --   PLT 218 194  --    Basic Metabolic Panel: Recent Labs  Lab 05/26/19 0840 05/27/19 0205  NA 137 135  K 3.9 4.2  CL 107 106  CO2 22  --   GLUCOSE 105* 111*  BUN 29* 32*  CREATININE 1.10 1.40*  CALCIUM 8.6*  --    GFR: Estimated Creatinine Clearance: 48.4 mL/min (A) (by C-G formula based on SCr of 1.4 mg/dL (H)). Liver Function Tests: Recent Labs  Lab 05/26/19 0840 05/27/19 0155  AST 37 52*  ALT 34 40  ALKPHOS 297* 268*  BILITOT 0.8 1.2  PROT 5.3* 5.2*  ALBUMIN 2.5* 2.5*   No results for input(s):  LIPASE, AMYLASE in the last 168 hours. No results for input(s): AMMONIA in the last 168 hours. Coagulation Profile: No results for input(s): INR, PROTIME in the last 168 hours. Cardiac Enzymes: No results for input(s): CKTOTAL, CKMB, CKMBINDEX, TROPONINI in the last 168 hours. BNP (last 3 results) No results for input(s): PROBNP in the last 8760 hours. HbA1C: No results for input(s): HGBA1C in the last 72 hours. CBG: No results for input(s): GLUCAP in the last 168 hours. Lipid Profile: No results for input(s): CHOL, HDL, LDLCALC, TRIG, CHOLHDL, LDLDIRECT in the last 72 hours. Thyroid Function Tests: No results for input(s): TSH, T4TOTAL, FREET4, T3FREE, THYROIDAB in the last 72 hours. Anemia Panel: No results for input(s): VITAMINB12, FOLATE, FERRITIN, TIBC, IRON, RETICCTPCT in the last 72 hours. Urine analysis:    Component Value Date/Time   COLORURINE AMBER (A) 04/24/2019 1034   APPEARANCEUR HAZY (A) 04/24/2019 1034   LABSPEC 1.024 04/24/2019 1034   PHURINE 5.0 04/24/2019 [place] 04/24/2019 1034   HGBUR NEGATIVE 04/24/2019 [place] 04/24/2019 [place] 04/24/2019 1034   PROTEINUR NEGATIVE 04/24/2019 1034   NITRITE NEGATIVE 04/24/2019 1034   LEUKOCYTESUR NEGATIVE 04/24/2019 1034    Radiological Exams on Admission: Ct Head Wo Contrast  Result Date: 05/27/2019 CLINICAL DATA:  Lightheadedness.   Syncope. EXAM: CT HEAD WITHOUT CONTRAST TECHNIQUE: Contiguous axial images were obtained from the base of the skull through the vertex without intravenous contrast. COMPARISON:  April 10, 2019 FINDINGS: Brain: No subdural, epidural, or subarachnoid hemorrhage. Cerebellum, brainstem, and basal sys cisterns are normal. 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 No acute cortical ischemia infarct noted. No mass effect or midline shift. Ventricles and sulci are stable. Vascular: No hyperdense vessel or unexpected calcification. Skull: Normal. Negative for fracture or focal lesion. Sinuses/Orbits: No acute finding. Other: None. IMPRESSION: No acute intracranial abnormalities. Electronically Signed   By: Dorise Bullion III M.D   On: 05/27/2019 03:17   Ct Angio Chest Pe W And/or Wo Contrast  Result Date: 05/27/2019 CLINICAL DATA:  Dizziness and syncope EXAM: CT ANGIOGRAPHY CHEST WITH CONTRAST TECHNIQUE: Multidetector CT imaging of the chest was performed using the standard protocol during bolus administration of intravenous contrast. Multiplanar CT image reconstructions and MIPs were obtained to evaluate the vascular anatomy. CONTRAST:  141mL OMNIPAQUE IOHEXOL 350 MG/ML SOLN COMPARISON:  May 02, 2019 FINDINGS: Cardiovascular: There is a optimal opacification of the pulmonary arteries. There is no central,segmental, or subsegmental filling defects within the pulmonary arteries. The heart is normal in size. No pericardial effusion or thickening. No evidence right heart strain. There is normal three-vessel brachiocephalic anatomy without proximal stenosis. Scattered aortic atherosclerotic calcifications are seen without aneurysmal dilatation. Mediastinum/Nodes: No hilar, mediastinal, or axillary adenopathy. Again noted is a 1.4 cm low-density lesion within the left thyroid lobe. Lungs/Pleura: There are small bilateral pleural effusions, left greater right. Patchy/streaky airspace opacity seen at both  lung bases which could be compressive atelectasis. Upper Abdomen: There is partially visualized multiple hypodense lesions seen throughout the liver. A moderate amount of abdominal ascites is seen. For Musculoskeletal: No chest wall abnormality. No acute or significant osseous findings. Anterior flowing osteophytes seen throughout the thoracic spine. Review of the MIP images confirms the above findings. IMPRESSION: 1. no central, segmental, or subsegmental pulmonary embolism. 2. Small bilateral pleural effusions, left greater than right with probable adjacent compressive atelectasis. 3. Partially visualized multiple hypodense liver lesions. 4. Moderate abdominal ascites. Electronically Signed  By: Prudencio Pair M.D.   On: 05/27/2019 03:40   Dg Chest Portable 1 View  Result Date: 05/27/2019 CLINICAL DATA:  Dizziness and syncope EXAM: PORTABLE CHEST 1 VIEW COMPARISON:  None. FINDINGS: There is mild cardiomegaly. A left-sided MediPort catheter is seen with the tip at the lower SVC. There is moderate left and small right pleural effusion. Hazy airspace opacity seen adjacent to lower lungs which is likely layering effusion and/or compressive atelectasis. No acute osseous abnormality. IMPRESSION: Moderate left and small right pleural effusion with probable adjacent layering effusion and/or atelectasis. Electronically Signed   By: Prudencio Pair M.D.   On: 05/27/2019 01:00   CT of the head shows no lesions.  There are bilateral small pleural effusions on the chest CT.  No blood clots noted in the lungs  Assessment/Plan #1 syncopal episode was brief.  No clear-cut cause is noted however patient is somewhat dehydrated and gentle hydration is being provided.  #2 metastatic liver cancer.  I have discussed with the family and they are inclined to ask for palliative care.  Study team to make arrangements for that.  Nobody is on call for the palliative service today Supportive treatment will be provided.  #3  dehydration appears to be at the heart of resolved.. #4 elevated troponin we will cycle these.  However it is felt by cardiology that it is the cardiotoxicity from the chemotherapeutic agents   DVT prophylaxis: He is on Eliquis Code Status: DNR as discussed with the family family Communication:  Disposition Plan: Home likely with palliative care Consults called:  Admission status: Full     Pearlean Brownie MD [facility] Pager [phone]  If 7PM-7AM, please contact night-coverage [email or web address] Password [facility]  05/27/2019, 6:55 AM

## 2019-05-27 NOTE — ED Notes (Signed)
Requested urine from patient. 

## 2019-05-27 NOTE — Progress Notes (Signed)
Patient presented early this morning after midnight via EMS complaining of dizziness, syncope and generalized weakness.  He stated that he had chemotherapy yesterday morning for liver cancer.  He was admitted after midnight by my partner and colleague Dr. Hulan Saas and I am in current agreement with this assessment and plan.  The patient fell early this AM.  Any headache, nausea, vomiting.  Denies any chest pain or abdominal pain but did have some diarrhea.  It is unclear if the patient passed out or not but when his daughter found him on the floor he was awake and his daughter did not see any convulsive activity and she believes he did not lose consciousness.    He was brought to the ED via EMS and initial evaluation showed troponins that were elevated at 166 but now started trending down. Cardiology wasconsulted and felt that chemotherapy may cause cardiotoxicity.  Patient is already anticoagulated with Eliquis for his atrial fibrillation and for a Left Leg DVT.  In the ED he was given 2 500 mL boluses of normal saline and had further work-up and imaging done with a CT head without contrast and a CT angio chest PE.  He will be admitted and treated for the following but not limited too:  Generalized Weakness with Recurrent Falls and ? Syncopal Episode -Admitted to Inpatient and will change Bed Status to Telemetry  -Get PT/OT to Evaluate and Treat -Cycled Cardiac Troponin and was 166 and now Trending down to 151 -Cardiology was consulted for further Evaluation and Recc's -Received NS boluses with 500 mL x1 -Will hold further Fluid given Hx of Ascites and Volume Overload -Of Not SARS-CoV-2 Pending -U/A unremarkable except for some small Hgb and 6-10 WBC -CT Head Showed "No acute intracranial abnormalities" -CTA showed "No central, segmental, or subsegmental pulmonary embolism. Small bilateral pleural effusions, left greater than right with probable adjacent compressive atelectasis. Partially visualized  multiple hypodense liver lesions. Moderate abdominal ascites."  -Will check ECHOCardiogram and TSH -Continue to Monitor closely  Dyspnea -Progressively getting worse and initially improved after Paracentesis on 05/22/2019 with removal of 5 Liters -Check ECHO -Give IV Lasix 20 mg if BP allows -Cardiology follow and appreciate further evaluation and Recc's  BPH -C/w Finasteride 5 mg po Daily   Metastatic Adenocarcinoma with Unknown Primary -Will Consult Dr. Benay Spice as a Courtesy  -Dr. Tonye Royalty discussed with family and they are inclined to ask for Palliative Care -Underwent Chemotherapy with Gemcitabine/Abxraane on 05/26/2019\ -[place] placed  AKI -Patient's BUN/Cr went from 29/1.10 -> 32/1.40 -Given two 500 mL boluses this AM; Hold further fluid due to Ascites and may even start diuresis as he appears volume overloaded -Give 1x of IV Lasix 20 mg if BP allows  -Avoid Nephrotoxic Medications, Contrast Dyes and Hypotension if possible -Continue to Monitor   Left Leg DVT -Recently found on Doppler done on 05/12/2019 -C/w with Eliquis  Elevated AST and Alk Phos -Patient's AST was mildly elevated at 52 and Alk Phos was 268 -In the setting of Metastatic Adenocarcinoma and Ascites -Check U/S Abdomen Complete -May need Paracentesis to be repeated  Abdominal Ascites -In the Setting of Cancer -Check U/S Abdomen Complete -May need a repeat Thoracentesis with Albumin Administration -CTA Chest Showed "Partially visualized multiple hypodense liver lesions. Moderate abdominal ascites."   DNR, poA  We will Continue to monitor patient's clinical response to intervention and repeat blood work and imaging in the a.m. and follow-up on Cardiology recommendations.  We will also consult patient's  primary Oncologist for further evaluation and Recommendations as well as Palliative Care Medicine

## 2019-05-28 ENCOUNTER — Inpatient Hospital Stay (HOSPITAL_COMMUNITY): Payer: Managed Care, Other (non HMO)

## 2019-05-28 DIAGNOSIS — R531 Weakness: Secondary | ICD-10-CM | POA: Diagnosis not present

## 2019-05-28 DIAGNOSIS — R55 Syncope and collapse: Secondary | ICD-10-CM | POA: Diagnosis not present

## 2019-05-28 DIAGNOSIS — R180 Malignant ascites: Secondary | ICD-10-CM

## 2019-05-28 LAB — COMPREHENSIVE METABOLIC PANEL
ALT: 31 U/L (ref 0–44)
AST: 40 U/L (ref 15–41)
Albumin: 2.3 g/dL — ABNORMAL LOW (ref 3.5–5.0)
Alkaline Phosphatase: 236 U/L — ABNORMAL HIGH (ref 38–126)
Anion gap: 10 (ref 5–15)
BUN: 37 mg/dL — ABNORMAL HIGH (ref 8–23)
CO2: 23 mmol/L (ref 22–32)
Calcium: 8.4 mg/dL — ABNORMAL LOW (ref 8.9–10.3)
Chloride: 104 mmol/L (ref 98–111)
Creatinine, Ser: 1.41 mg/dL — ABNORMAL HIGH (ref 0.61–1.24)
GFR calc Af Amer: 55 mL/min — ABNORMAL LOW (ref >60)
GFR calc non Af Amer: 47 mL/min — ABNORMAL LOW (ref >60)
Glucose, Bld: 112 mg/dL — ABNORMAL HIGH (ref 70–99)
Potassium: 3.6 mmol/L (ref 3.5–5.1)
Sodium: 137 mmol/L (ref 135–145)
Total Bilirubin: 1.5 mg/dL — ABNORMAL HIGH (ref 0.3–1.2)
Total Protein: 4.8 g/dL — ABNORMAL LOW (ref 6.5–8.1)

## 2019-05-28 LAB — CBC WITH DIFFERENTIAL/PLATELET
Abs Immature Granulocytes: 0.12 10*3/uL — ABNORMAL HIGH (ref 0.00–0.07)
Basophils Absolute: 0 10*3/uL (ref 0.0–0.1)
Basophils Relative: 0 %
Eosinophils Absolute: 0.1 10*3/uL (ref 0.0–0.5)
Eosinophils Relative: 1 %
HCT: 40.1 % (ref 39.0–52.0)
Hemoglobin: 12.6 g/dL — ABNORMAL LOW (ref 13.0–17.0)
Immature Granulocytes: 2 %
Lymphocytes Relative: 5 %
Lymphs Abs: 0.4 10*3/uL — ABNORMAL LOW (ref 0.7–4.0)
MCH: 29.4 pg (ref 26.0–34.0)
MCHC: 31.4 g/dL (ref 30.0–36.0)
MCV: 93.5 fL (ref 80.0–100.0)
Monocytes Absolute: 0.2 10*3/uL (ref 0.1–1.0)
Monocytes Relative: 2 %
Neutro Abs: 7 10*3/uL (ref 1.7–7.7)
Neutrophils Relative %: 90 %
Platelets: 168 10*3/uL (ref 150–400)
RBC: 4.29 MIL/uL (ref 4.22–5.81)
RDW: 17.5 % — ABNORMAL HIGH (ref 11.5–15.5)
WBC: 7.7 10*3/uL (ref 4.0–10.5)
nRBC: 0 % (ref 0.0–0.2)

## 2019-05-28 LAB — ALBUMIN, PLEURAL OR PERITONEAL FLUID: Albumin, Fluid: 0.9 g/dL

## 2019-05-28 LAB — PROTEIN, PLEURAL OR PERITONEAL FLUID: Total protein, fluid: <3 g/dL

## 2019-05-28 LAB — GLUCOSE, PLEURAL OR PERITONEAL FLUID: Glucose, Fluid: 112 mg/dL

## 2019-05-28 LAB — BODY FLUID CELL COUNT WITH DIFFERENTIAL
Eos, Fluid: 0 %
Lymphs, Fluid: 17 %
Monocyte-Macrophage-Serous Fluid: 76 % (ref 50–90)
Neutrophil Count, Fluid: 7 % (ref 0–25)
Total Nucleated Cell Count, Fluid: 48 cu mm (ref 0–1000)

## 2019-05-28 LAB — PHOSPHORUS: Phosphorus: 3.2 mg/dL (ref 2.5–4.6)

## 2019-05-28 LAB — LACTATE DEHYDROGENASE, PLEURAL OR PERITONEAL FLUID: LD, Fluid: 70 U/L — ABNORMAL HIGH (ref 3–23)

## 2019-05-28 LAB — PROTIME-INR
INR: 1.7 — ABNORMAL HIGH (ref 0.8–1.2)
Prothrombin Time: 20 seconds — ABNORMAL HIGH (ref 11.4–15.2)

## 2019-05-28 LAB — MAGNESIUM: Magnesium: 2.3 mg/dL (ref 1.7–2.4)

## 2019-05-28 MED ORDER — ALBUMIN HUMAN 25 % IV SOLN
25.0000 g | Freq: Once | INTRAVENOUS | Status: AC
  Administered 2019-05-28: 25 g via INTRAVENOUS
  Filled 2019-05-28: qty 100

## 2019-05-28 MED ORDER — TAMSULOSIN HCL 0.4 MG PO CAPS
0.4000 mg | ORAL_CAPSULE | Freq: Every day | ORAL | Status: DC
  Administered 2019-05-28 – 2019-06-02 (×6): 0.4 mg via ORAL
  Filled 2019-05-28 (×6): qty 1

## 2019-05-28 MED ORDER — LIDOCAINE HCL 1 % IJ SOLN
INTRAMUSCULAR | Status: AC
  Filled 2019-05-28: qty 10

## 2019-05-28 MED ORDER — ALBUMIN HUMAN 25 % IV SOLN: 12.5000 g | Freq: Once | INTRAVENOUS | Status: DC

## 2019-05-28 NOTE — Progress Notes (Signed)
Patient having difficulty urinating, complains of bladder pressure. Bladder scan showing 284 ml. Pt has been urinating in condom cath. Per daughter, pt has a urinary history and has difficulty when not taking his Flomax. MD paged and Flomax was reordered. Will continue to monitor.

## 2019-05-28 NOTE — Progress Notes (Signed)
PROGRESS NOTE    Darius Ellis  ZOX:[id number] DOB: [birth year] DOA: 05/27/2019 PCP: Libby Maw, MD   Brief Narrative:  On 05/27/2019 Patient presented early that morning after midnight via EMS complaining of dizziness, syncope and generalized weakness.  He stated that he had chemotherapy yesterday morning for liver cancer.  He was admitted after midnight by my partner and colleague Dr. Hulan Saas and I am in current agreement with this assessment and plan.  The patient fell early this AM.  Any headache, nausea, vomiting.  Denies any chest pain or abdominal pain but did have some diarrhea.  It is unclear if the patient passed out or not but when his daughter found him on the floor he was awake and his daughter did not see any convulsive activity and she believes he did not lose consciousness.    He was brought to the ED via EMS and initial evaluation showed troponins that were elevated at 166 but now started trending down. Cardiology wasconsulted and felt that chemotherapy may cause cardiotoxicity.  Patient is already anticoagulated with Eliquis for his atrial fibrillation and for a Left Leg DVT.  In the ED he was given 2 500 mL boluses of normal saline and had further work-up and imaging done with a CT head without contrast and a CT angio chest PE.   **Interim History Patient's abdomen is more distended but he felt less short of breath slightly today.  Underwent a repeat paracentesis and drained 5 L.  Had some acute urine retention Flomax was restarted.  PT recommending SNF.  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 Syncopal Episode -Admitted to Inpatient and will change Bed Status to Telemetry  -Get  PT/OT to Evaluate and Treat and OT recommend SNF -Cycled Cardiac Troponin and was 166 and now Trending down to 151 -Cardiology was consulted for further Evaluation and Recc's and they feel this is Orthostatic  -Received NS boluses with 500 mL x1 -Will hold further Fluid given Hx of Ascites and Volume Overload;  -Of Not SARS-CoV-2 Pending -U/A unremarkable except for some small Hgb and 6-10 WBC -CT Head Showed "No acute intracranial abnormalities" -CTA showed "No central, segmental, or subsegmental pulmonary embolism. Small bilateral pleural effusions, left greater than right with probable adjacent compressive atelectasis. Partially visualized multiple hypodense liver lesions. Moderate abdominal ascites."  -Will check ECHOCardiogram and TSH -ECHO Cardiogram showed normal left ventricular and right ventricular systolic function and size but his right atrial pressure was estimated to be less than 3 mm -Continue to Monitor closely and feel that his worsening ascites is contributing -Continue with incentive spirometry and had a therapeutic paracentesis to be done today -Check orthostatic vital signs if possible  Dyspnea -Progressively getting worse and initially improved after Paracentesis on 05/22/2019 with removal of 5 Liters -Check ECHO -Give IV Lasix 20 mg if BP allows but was low yesterday so held off; will give 25 g of albumin today and order a therapeutic paracentesis -Cardiology follow and appreciate further evaluation and Recc's  Abdominal distention -In the setting of ascites and 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 Benay Spice as a Courtesy    -Dr. Tonye Royalty discussed with family and they are inclined to ask for Palliative Care -Underwent Chemotherapy with Gemcitabine/Abxraane on 05/26/2019\ -[place] placed there to meet with the patient and daughter on 05/29/2019 at 1500  AKI -Patient's BUN/Cr went from 29/1.10 -> 32/1.40 -> 37/1.41 -Given two 500 mL boluses this AM; Hold further fluid due to Ascites and may even start diuresis as he appears volume overloaded -Give 1x of IV Lasix 20 mg if BP allows  did not so we will give him albumin 25 g once and obtain a therapeutic paracentesis -Avoid Nephrotoxic Medications, Contrast Dyes and Hypotension if possible -Continue to Monitor   Left Leg DVT -Recently found on Doppler done on 05/12/2019 -C/w with Eliquis  Elevated AST and Alk Phos -Patient's AST was mildly elevated at 52 and Alk Phos was 268; now alk phos is 236 and AST is down to 40 -In the setting of Metastatic Adenocarcinoma and Ascites -Check U/S Abdomen Complete and showed ". Multiple metastatic lesions in the liver.Signs of anasarca with ascites and generalized edema. No signs of biliary ductal dilation -Will repeat Paracentesis today   Abdominal Ascites -In the Setting of Cancer -Checked U/S Abdomen Complete and as above -Had a Paracentesis done today and given Albumin Administration -CTA Chest Showed "Partially visualized multiple hypodense liver lesions. Moderate abdominal ascites."   GOC: DNR, poA -Palliative Consulted for further GOC Discussions   DVT prophylaxis: Anticoagulated with Apixaban  Code Status: DO NOT RESUSCITATE  Family Communication: Discussed with Daughter at bedside  Disposition Plan: Pending further clinical Improvement   Consultants:   Palliative Care Medicine  Cardiology  Medical Oncology Notified via Courtesy by Epic   Procedures: U/S Paracentesis   Antimicrobials:  Anti-infectives (From admission, onward)   None     Subjective: Seen and examined at bedside  and he felt a little fatigued.  He is significantly hard of hearing.  Daughter thinks his abdomen is more distended.  Patient denies any current shortness of breath but had some trouble urinating.  No chest pain.  No other concerns or plans at this time and still feels pretty weak  Objective: Vitals:   05/28/19 1403 05/28/19 1406 05/28/19 1434 05/28/19 1454  BP: 127/75 118/69 120/69 123/68  Pulse:    (!) 103  Resp:    17  Temp:    98.1 F (36.7 C)  TempSrc:    Oral  SpO2:    98%  Weight:      Height:        Intake/Output Summary (Last 24 hours) at 05/28/2019 1830 Last data filed at 05/28/2019 1501 Gross per 24 hour  Intake --  Output 1100 ml  Net -1100 ml   Filed Weights   05/27/19 0035  Weight: 82.9 kg   Examination: Physical Exam:  Constitutional: Thin elderly chronically ill-appearing Caucasian male in mild distress appears slightly uncomfortable Eyes: Lids and conjunctivae normal, sclerae anicteric  ENMT: External Ears, Nose appear normal.  Patient is extremely hard of hearing Neck: Appears normal, supple, no cervical masses, normal ROM, no appreciable thyromegaly; no JVD Respiratory: Diminished to auscultation bilaterally but does have some coarse breath sounds, no wheezing, rales, rhonchi or crackles. Normal respiratory effort and patient is not tachypenic. No accessory muscle use.  Unlabored breathing Cardiovascular: RRR, no  murmurs / rubs / gallops. S1 and S2 auscultated.  Significant 2-3+ lower extremity edema.  Abdomen: Soft, mildly tender, significantly distended with ascites. Bowel sounds positive.  GU: Deferred.  Currently wearing a condom catheter Musculoskeletal: No clubbing / cyanosis of digits/nails. Normal strength and muscle tone.  Skin: No rashes, lesions, ulcers but has some mild bruising on lower extremities. No induration; Warm and dry.  Neurologic: CN 2-12 grossly intact with no focal deficits. Romberg sign and cerebellar reflexes not assessed.   Psychiatric: Normal judgment and insight. Alert and awake.  Mildly anxious mood and appropriate affect.   Data Reviewed: I have personally reviewed following labs and imaging studies  CBC: Recent Labs  Lab 05/26/19 0840 05/27/19 0155 05/27/19 0205 05/28/19 0537  WBC 5.8 9.4  --  7.7  NEUTROABS 3.5 7.5  --  7.0  HGB 13.9 13.0 13.6 12.6*  HCT 43.5 40.7 40.0 40.1  MCV 91.6 91.7  --  93.5  PLT 218 194  --  774   Basic Metabolic Panel: Recent Labs  Lab 05/26/19 0840 05/27/19 0205 05/28/19 0537  NA 137 135 137  K 3.9 4.2 3.6  CL 107 106 104  CO2 22  --  23  GLUCOSE 105* 111* 112*  BUN 29* 32* 37*  CREATININE 1.10 1.40* 1.41*  CALCIUM 8.6*  --  8.4*  MG  --   --  2.3  PHOS  --   --  3.2   GFR: Estimated Creatinine Clearance: 48 mL/min (A) (by C-G formula based on SCr of 1.41 mg/dL (H)). Liver Function Tests: Recent Labs  Lab 05/26/19 0840 05/27/19 0155 05/28/19 0537  AST 37 52* 40  ALT 34 40 31  ALKPHOS 297* 268* 236*  BILITOT 0.8 1.2 1.5*  PROT 5.3* 5.2* 4.8*  ALBUMIN 2.5* 2.5* 2.3*   No results for input(s): LIPASE, AMYLASE in the last 168 hours. No results for input(s): AMMONIA in the last 168 hours. Coagulation Profile: Recent Labs  Lab 05/28/19 0537  INR 1.7*   Cardiac Enzymes: No results for input(s): CKTOTAL, CKMB, CKMBINDEX, TROPONINI in the last 168 hours. BNP (last 3 results) No results for input(s): PROBNP in the last 8760 hours. HbA1C: No results for input(s): HGBA1C in the last 72 hours. CBG: No results for input(s): GLUCAP in the last 168 hours. Lipid Profile: No results for input(s): CHOL, HDL, LDLCALC, TRIG, CHOLHDL, LDLDIRECT in the last 72 hours. Thyroid Function Tests: No results for input(s): TSH, T4TOTAL, FREET4, T3FREE, THYROIDAB in the last 72 hours. Anemia Panel: No results for input(s): VITAMINB12, FOLATE, FERRITIN, TIBC, IRON, RETICCTPCT in the last 72 hours. Sepsis Labs: No results for input(s): PROCALCITON, LATICACIDVEN  in the last 168 hours.  Recent Results (from the past 240 hour(s))  SARS CORONAVIRUS 2 (TAT 6-24 HRS) Nasopharyngeal Nasopharyngeal Swab     Status: None   Collection Time: 05/27/19  4:53 AM   Specimen: Nasopharyngeal Swab  Result Value Ref Range Status   SARS Coronavirus 2 NEGATIVE NEGATIVE Final    Comment: (NOTE) SARS-CoV-2 target nucleic acids are NOT DETECTED. The SARS-CoV-2 RNA is generally detectable in upper and lower respiratory specimens during the acute phase of infection. Negative results do not preclude SARS-CoV-2 infection, do not rule out co-infections with other pathogens, and should not be used as the sole basis for treatment or other patient management decisions. Negative results must be combined with clinical observations, patient history, and epidemiological information. The expected result is Negative. Fact Sheet for Patients: SugarRoll.be Fact Sheet  for Healthcare Providers: https://www.woods-mathews.com/ This test is not yet approved or cleared by the [place] and  has been authorized for detection and/or diagnosis of SARS-CoV-2 by FDA under an Emergency Use Authorization (EUA). This EUA will remain  in effect (meaning this test can be used) for the duration of the COVID-19 declaration under Section 56 4(b)(1) of the Act, 21 U.S.C. section 360bbb-3(b)(1), unless the authorization is terminated or revoked sooner. Performed at [facility], [place] [address] [place], [place] [address]     Radiology Studies: Ct Head Wo Contrast  Result Date: 05/27/2019 CLINICAL DATA:  Lightheadedness.  Syncope. EXAM: CT HEAD WITHOUT CONTRAST TECHNIQUE: Contiguous axial images were obtained from the base of the skull through the vertex without intravenous contrast. COMPARISON:  April 10, 2019 FINDINGS: Brain: No subdural, epidural, or subarachnoid hemorrhage. Cerebellum, brainstem, and basal sys cisterns are normal. Lacunar  infarcts are again seen in the basal ganglia. No acute cortical ischemia infarct noted. No mass effect or midline shift. Ventricles and sulci are stable. Vascular: No hyperdense vessel or unexpected calcification. Skull: Normal. Negative for fracture or focal lesion. Sinuses/Orbits: No acute finding. Other: None. IMPRESSION: No acute intracranial abnormalities. Electronically Signed   By: Dorise Bullion III M.D   On: 05/27/2019 03:17   Ct Angio Chest Pe W And/or Wo Contrast  Result Date: 05/27/2019 CLINICAL DATA:  Dizziness and syncope EXAM: CT ANGIOGRAPHY CHEST WITH CONTRAST TECHNIQUE: Multidetector CT imaging of the chest was performed using the standard protocol during bolus administration of intravenous contrast. Multiplanar CT image reconstructions and MIPs were obtained to evaluate the vascular anatomy. CONTRAST:  140m OMNIPAQUE IOHEXOL 350 MG/ML SOLN COMPARISON:  May 02, 2019 FINDINGS: Cardiovascular: There is a optimal opacification of the pulmonary arteries. There is no central,segmental, or subsegmental filling defects within the pulmonary arteries. The heart is normal in size. No pericardial effusion or thickening. No evidence right heart strain. There is normal three-vessel brachiocephalic anatomy without proximal stenosis. Scattered aortic atherosclerotic calcifications are seen without aneurysmal dilatation. Mediastinum/Nodes: No hilar, mediastinal, or axillary adenopathy. Again noted is a 1.4 cm low-density lesion within the left thyroid lobe. Lungs/Pleura: There are small bilateral pleural effusions, left greater right. Patchy/streaky airspace opacity seen at both lung bases which could be compressive atelectasis. Upper Abdomen: There is partially visualized multiple hypodense lesions seen throughout the liver. A moderate amount of abdominal ascites is seen. For Musculoskeletal: No chest wall abnormality. No acute or significant osseous findings. Anterior flowing osteophytes seen  throughout the thoracic spine. Review of the MIP images confirms the above findings. IMPRESSION: 1. no central, segmental, or subsegmental pulmonary embolism. 2. Small bilateral pleural effusions, left greater than right with probable adjacent compressive atelectasis. 3. Partially visualized multiple hypodense liver lesions. 4. Moderate abdominal ascites. Electronically Signed   By: BPrudencio PairM.D.   On: 05/27/2019 03:40   UKoreaAbdomen Complete  Result Date: 05/27/2019 CLINICAL DATA:  Acute renal failure, ascites metastatic disease to the liver. EXAM: ABDOMEN ULTRASOUND COMPLETE COMPARISON:  05/27/2019 and 05/02/2019 FINDINGS: Gallbladder: Gallbladder is contracted with wall thickening in the setting of generalized edema. No sonographic Murphy's sign reported by the sonographer. Common bile duct: Diameter: 3 mm Liver: Diffuse heterogeneity of the liver with multiple areas of hypo coexisting metastatic disease. These are better displayed on recent CT evaluations. Portal vein is patent on color Doppler imaging with normal direction of blood flow towards the liver. IVC: No abnormality visualized. Pancreas: Visualized portion unremarkable. Spleen: Size and appearance within  normal limits. Right Kidney: Length: 9.5 cm. Cortical thinning. No signs of hydronephrosis. Renal sinus lipomatosis. Hypoechoic renal lesions likely small proteinaceous or hemorrhagic cysts unchanged from recent CT of the chest, abdomen and pelvis. Left Kidney: Length: 11.5 cm. Limited assessment of the left kidney. Visualized portions are normal. No visible hydronephrosis. Abdominal aorta: Distal portion of the aorta at the bifurcation is not well visualized. No signs of aneurysm and visible portions. Other findings: Ascites present in all 4 quadrants. Signs of bowel edema, generalized likely related to underlying anasarca. IMPRESSION: 1. Multiple metastatic lesions in the liver. 2. Signs of anasarca with ascites and generalized edema. 3. No  signs of biliary ductal dilation. Electronically Signed   By: Zetta Bills M.D.   On: 05/27/2019 11:09   US Paracentesis  Result Date: 05/28/2019 INDICATION: Patient with history of metastatic adenocarcinoma of unknown primary, left lower extremity DVT, recurrent ascites; request received for diagnostic and therapeutic paracentesis. EXAM: ULTRASOUND GUIDED DIAGNOSTIC AND THERAPEUTIC PARACENTESIS MEDICATIONS: None COMPLICATIONS: None immediate. PROCEDURE: Informed written consent was obtained from the patient after a discussion of the risks, benefits and alternatives to treatment. A timeout was performed prior to the initiation of the procedure. Initial ultrasound scanning demonstrates a large amount of ascites within the right mid to lower abdominal quadrant. The right mid to lower abdomen was prepped and draped in the usual sterile fashion. 1% lidocaine was used for local anesthesia. Following this, a 19 gauge, 10-cm, Yueh catheter was introduced. An ultrasound image was saved for documentation purposes. The paracentesis was performed. The catheter was removed and a dressing was applied. The patient tolerated the procedure well without immediate post procedural complication. Patient was administered 25 grams IV albumin by floor nurse on unit FINDINGS: A total of approximately 5 liters of slightly hazy, yellow fluid was removed. Samples were sent to the laboratory as requested by the clinical team. IMPRESSION: Successful ultrasound-guided diagnostic and therapeutic paracentesis yielding 5 liters of peritoneal fluid. Read by: Rowe Robert, PA-C Electronically Signed   By: Lucrezia Europe M.D.   On: 05/28/2019 14:52   Dg Chest Portable 1 View  Result Date: 05/27/2019 CLINICAL DATA:  Dizziness and syncope EXAM: PORTABLE CHEST 1 VIEW COMPARISON:  None. FINDINGS: There is mild cardiomegaly. A left-sided MediPort catheter is seen with the tip at the lower SVC. There is moderate left and small right pleural effusion.  Hazy airspace opacity seen adjacent to lower lungs which is likely layering effusion and/or compressive atelectasis. No acute osseous abnormality. IMPRESSION: Moderate left and small right pleural effusion with probable adjacent layering effusion and/or atelectasis. Electronically Signed   By: Prudencio Pair M.D.   On: 05/27/2019 01:00   Scheduled Meds:  apixaban  5 mg Oral BID   Chlorhexidine Gluconate Cloth  6 each Topical Daily   feeding supplement (ENSURE ENLIVE)  237 mL Oral BID BM   finasteride  5 mg Oral Daily   fluticasone  2 spray Each Nare Daily   folic acid  1 mg Oral Daily   gabapentin  600 mg Oral BID   lidocaine       loratadine  10 mg Oral Daily   tamsulosin  0.4 mg Oral Daily   thiamine  100 mg Oral Daily   topiramate  50 mg Oral QHS   vitamin B-12  1,000 mcg Oral Daily   Continuous Infusions:   LOS: 1 day   Kerney Elbe, DO [facility] PAGER is on AMION  If 7PM-7AM, please contact night-coverage  [email or web address]

## 2019-05-28 NOTE — Progress Notes (Signed)
Patient continued to c/o bladder pressure and inability to urinate. Bladder scan showed 561 ml. MD Alfredia Ferguson paged and order received to I&O per protocol. I&O done at 1920 with 600 ml returned. Pt voiced immediate relief. Condom cath reapplied and pt now resting in bed comfortably.

## 2019-05-28 NOTE — Procedures (Signed)
Ultrasound-guided diagnostic and therapeutic paracentesis performed yielding 5 liters of slightly hazy, yellow fluid. No immediate complications. A portion of the fluid was sent to the lab for preordered studies. EBL none.

## 2019-05-28 NOTE — Progress Notes (Signed)
Progress Note  Patient Name: Darius Ellis Date of Encounter: 05/28/2019  Primary Cardiologist: No primary care provider on file.     Patient Profile     78 y.o. 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  Noted to be hypotensive and prerenal.  Also with complaints of progressive and very limiting dyspnea on exertion.  Has had serial paracenteses, after the first dyspnea improved after the second it did not.   CT scan on admission demonstrated no pulmonary embolism, but small effusions.  Also with a history of chest pain   consultation for syncope presumed orthostatic and + troponin but without a delta.  Echocardiogram 05/27/2019 demonstrated normal left ventricular and right ventricular function and size RA pressure estimated at less than 3 mm   Subjective   Much better this am,  Less SOB no chest pain for paracentesis   Inpatient Medications    Scheduled Meds:  apixaban  5 mg Oral BID   Chlorhexidine Gluconate Cloth  6 each Topical Daily   feeding supplement (ENSURE ENLIVE)  237 mL Oral BID BM   finasteride  5 mg Oral Daily   fluticasone  2 spray Each Nare Daily   folic acid  1 mg Oral Daily   gabapentin  600 mg Oral BID   loratadine  10 mg Oral Daily   thiamine  100 mg Oral Daily   topiramate  50 mg Oral QHS   vitamin B-12  1,000 mcg Oral Daily   Continuous Infusions:  PRN Meds: HYDROcodone-acetaminophen, ondansetron **OR** ondansetron (ZOFRAN) IV, senna-docusate   Vital Signs    Vitals:   05/27/19 1430 05/27/19 1529 05/27/19 2030 05/28/19 0508  BP: 131/85 130/77 114/73 111/76  Pulse: 77 97 97 92  Resp: 20 18 18 18   Temp:  98.1 F (36.7 C) 98.3 F (36.8 C) 98.7 F (37.1 C)  TempSrc:  Oral  Oral  SpO2: 98% 100% 96% 98%  Weight:       Height:        Intake/Output Summary (Last 24 hours) at 05/28/2019 0815 Last data filed at 05/28/2019 0500 Gross per 24 hour  Intake --  Output 800 ml  Net -800 ml   Last 3 Weights 05/27/2019 05/26/2019 05/15/2019  Weight (lbs) 182 lb 12.9 oz 182 lb 12.8 oz 192 lb  Weight (kg) 82.92 kg 82.918 kg 87.091 kg      Telemetry    Sinus 60-100  - Personally Reviewed  ECG       Physical Exam    GEN: No acute distress.   Cardiac: RRR, 2/6 murmur   Respiratory: bibasilar crackles  GI: Soft,  istended  MS: 2-3+ edema; No deformity. Neuro:  Nonfocal  Psych: Normal affect   Labs    High Sensitivity Troponin:   Recent Labs  Lab 05/27/19 0155 05/27/19 0330  TROPONINIHS 166* 151*      Chemistry Recent Labs  Lab 05/26/19 0840 05/27/19 0155 05/27/19 0205 05/28/19 0537  NA 137  --  135 137  K 3.9  --  4.2 3.6  CL 107  --  106 104  CO2 22  --   --  23  GLUCOSE 105*  --  111* 112*  BUN 29*  --  32* 37*  CREATININE 1.10  --  1.40* 1.41*  CALCIUM 8.6*  --   --  8.4*  PROT 5.3* 5.2*  --  4.8*  ALBUMIN 2.5* 2.5*  --  2.3*  AST 37 52*  --  40  ALT 34 40  --  31  ALKPHOS 297* 268*  --  236*  BILITOT 0.8 1.2  --  1.5*  GFRNONAA >60  --   --  47*  GFRAA >60  --   --  55*  ANIONGAP 8  --   --  10     Hematology Recent Labs  Lab 05/26/19 0840 05/27/19 0155 05/27/19 0205 05/28/19 0537  WBC 5.8 9.4  --  7.7  RBC 4.75 4.44  --  4.29  HGB 13.9 13.0 13.6 12.6*  HCT 43.5 40.7 40.0 40.1  MCV 91.6 91.7  --  93.5  MCH 29.3 29.3  --  29.4  MCHC 32.0 31.9  --  31.4  RDW 17.2* 17.4*  --  17.5*  PLT 218 194  --  168    BNPNo results for input(s): BNP, PROBNP in the last 168 hours.   DDimer No results for input(s): DDIMER in the last 168 hours.   Radiology    Ct Head Wo Contrast  Result Date: 05/27/2019 CLINICAL DATA:  Lightheadedness.  Syncope. EXAM: CT HEAD WITHOUT CONTRAST TECHNIQUE: Contiguous axial images were obtained from the base of the skull through the vertex  without intravenous contrast. COMPARISON:  April 10, 2019 FINDINGS: Brain: No subdural, epidural, or subarachnoid hemorrhage. Cerebellum, brainstem, and basal sys cisterns are normal. 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 No acute cortical ischemia infarct noted. No mass effect or midline shift. Ventricles and sulci are stable. Vascular: No hyperdense vessel or unexpected calcification. Skull: Normal. Negative for fracture or focal lesion. Sinuses/Orbits: No acute finding. Other: None. IMPRESSION: No acute intracranial abnormalities. Electronically Signed   By: Dorise Bullion III M.D   On: 05/27/2019 03:17   Ct Angio Chest Pe W And/or Wo Contrast  Result Date: 05/27/2019 CLINICAL DATA:  Dizziness and syncope EXAM: CT ANGIOGRAPHY CHEST WITH CONTRAST TECHNIQUE: Multidetector CT imaging of the chest was performed using the standard protocol during bolus administration of intravenous contrast. Multiplanar CT image reconstructions and MIPs were obtained to evaluate the vascular anatomy. CONTRAST:  145mL OMNIPAQUE IOHEXOL 350 MG/ML SOLN COMPARISON:  May 02, 2019 FINDINGS: Cardiovascular: There is a optimal opacification of the pulmonary arteries. There is no central,segmental, or subsegmental filling defects within the pulmonary arteries. The heart is normal in size. No pericardial effusion or thickening. No evidence right heart strain. There is normal three-vessel brachiocephalic anatomy without proximal stenosis. Scattered aortic atherosclerotic calcifications are seen without aneurysmal dilatation. Mediastinum/Nodes: No hilar, mediastinal, or axillary adenopathy. Again noted is a 1.4 cm low-density lesion within the left thyroid lobe. Lungs/Pleura: There are small bilateral pleural effusions, left greater right. Patchy/streaky airspace opacity seen at both lung bases which could be compressive atelectasis. Upper Abdomen: There is partially visualized multiple hypodense lesions seen  throughout the liver. A moderate amount of abdominal ascites is seen. For Musculoskeletal: No chest wall abnormality. No acute or significant osseous findings. Anterior flowing osteophytes seen throughout the thoracic spine. Review of the MIP images confirms the above findings. IMPRESSION: 1. no central, segmental, or subsegmental pulmonary embolism. 2. Small bilateral pleural effusions, left greater than right with probable adjacent compressive atelectasis. 3. Partially visualized multiple hypodense liver lesions. 4. Moderate abdominal ascites. Electronically Signed   By: Ebony Cargo.D.  On: 05/27/2019 03:40   US Abdomen Complete  Result Date: 05/27/2019 CLINICAL DATA:  Acute renal failure, ascites metastatic disease to the liver. EXAM: ABDOMEN ULTRASOUND COMPLETE COMPARISON:  05/27/2019 and 05/02/2019 FINDINGS: Gallbladder: Gallbladder is contracted with wall thickening in the setting of generalized edema. No sonographic Murphy's sign reported by the sonographer. Common bile duct: Diameter: 3 mm Liver: Diffuse heterogeneity of the liver with multiple areas of hypo coexisting metastatic disease. These are better displayed on recent CT evaluations. Portal vein is patent on color Doppler imaging with normal direction of blood flow towards the liver. IVC: No abnormality visualized. Pancreas: Visualized portion unremarkable. Spleen: Size and appearance within normal limits. Right Kidney: Length: 9.5 cm. Cortical thinning. No signs of hydronephrosis. Renal sinus lipomatosis. Hypoechoic renal lesions likely small proteinaceous or hemorrhagic cysts unchanged from recent CT of the chest, abdomen and pelvis. Left Kidney: Length: 11.5 cm. Limited assessment of the left kidney. Visualized portions are normal. No visible hydronephrosis. Abdominal aorta: Distal portion of the aorta at the bifurcation is not well visualized. No signs of aneurysm and visible portions. Other findings: Ascites present in all 4 quadrants.  Signs of bowel edema, generalized likely related to underlying anasarca. IMPRESSION: 1. Multiple metastatic lesions in the liver. 2. Signs of anasarca with ascites and generalized edema. 3. No signs of biliary ductal dilation. Electronically Signed   By: Zetta Bills M.D.   On: 05/27/2019 11:09   Dg Chest Portable 1 View  Result Date: 05/27/2019 CLINICAL DATA:  Dizziness and syncope EXAM: PORTABLE CHEST 1 VIEW COMPARISON:  None. FINDINGS: There is mild cardiomegaly. A left-sided MediPort catheter is seen with the tip at the lower SVC. There is moderate left and small right pleural effusion. Hazy airspace opacity seen adjacent to lower lungs which is likely layering effusion and/or compressive atelectasis. No acute osseous abnormality. IMPRESSION: Moderate left and small right pleural effusion with probable adjacent layering effusion and/or atelectasis. Electronically Signed   By: Prudencio Pair M.D.   On: 05/27/2019 01:00    Cardiac Studies  As above    Assessment & Plan    1. Falls-recurred probably orthostatic 2. Dyspnea on exertion-worsening 3. 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 DVT on apixaban 5. Elevated troponin  Would use incentive spirometer given limited respiratory excursion 2/2 asciets Continue anticoagulation  Orthostatic VS     [facility] will sign off.   Medication Recommendations:   None Other recommendations (labs, testing, etc):  none Follow up as an outpatient:  no  For questions or updates, please contact [facility] Please consult [email or web address] for contact info under        Signed, Virl Axe, MD  05/28/2019, 8:15 AM

## 2019-05-28 NOTE — Evaluation (Signed)
Occupational Therapy Evaluation Patient Details Name: Darius Ellis MRN: [id number] DOB: 1941/05/25 Today's Date: 05/28/2019    History of Present Illness Darius Ellis is a 78 y.o. 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 CT showed no acute abnormalities   Clinical Impression   PTA Pt reports that he was mod I with the use of a RW. However he does admit falling 2 times in the past week. Today Pt with noted BLE edema (tender to touch) decreased cognition, and flat affect. Pt agreeable to therapy. Pt was mod A for bed mobility, and initially min A to sit EOB with posterior lean but able to progress to min guard. With highly elevated bed, Pt was able to come to standing with RW and min A for boost and balance. He maintained standing for approx a min before sitting down. He declined sitting in the chair. He was able to perform minimal grooming tasks EOB, but was max A for LB ADL at this time. Recommending SNF post-acute to maximize safety and independence in ADL and functional transfers. No family present at the time of session, and Pt unreliable historian.   Orthostatics: 119/78 (91) supine 97/70 (109) sitting EOB 111/72 (111) sitting EOB Unable to read in standing (stood for about a min) 119/71 (112) taken in sitting immediately after standing    Follow Up Recommendations  SNF;Supervision/Assistance - 24 hour    Equipment Recommendations  3 in 1 bedside commode    Recommendations for Other Services       Precautions / Restrictions Precautions Precautions: Fall Precaution Comments: orthostatic Restrictions Weight Bearing Restrictions: No      Mobility Bed Mobility Overal bed mobility: Needs Assistance Bed Mobility: Supine to  Sit;Sit to Supine     Supine to sit: Mod assist Sit to supine: Mod assist   General bed mobility comments: assist for BLE in and out of bed, use of pad to assist with hips EOB  Transfers Overall transfer level: Needs assistance Equipment used: Rolling walker (2 wheeled) Transfers: Sit to/from Stand Sit to Stand: Min assist;From elevated surface         General transfer comment: bed elevated to facilitate transfer, vc for safe hand placement, min A for balance and boost    Balance Overall balance assessment: Needs assistance Sitting-balance support: Single extremity supported;Bilateral upper extremity supported;No upper extremity supported;Feet supported Sitting balance-Leahy Scale: Fair Sitting balance - Comments: initially posterior lean, but improved with time   Standing balance support: Bilateral upper extremity supported(HEAVY) Standing balance-Leahy Scale: Poor Standing balance comment: heavy dependence on RW                           ADL either performed or assessed with clinical judgement   ADL Overall ADL's : Needs assistance/impaired Eating/Feeding: Set up Eating/Feeding Details (indicate cue type and reason): had eaten minimal amount of breakfast Grooming: Set up;Sitting;Wash/dry hands Grooming Details (indicate cue type and reason): fatigues sitting EOB quickly Upper Body Bathing: Moderate assistance   Lower Body Bathing: Maximal assistance   Upper Body Dressing : Minimal assistance   Lower Body Dressing: Maximal assistance;Sitting/lateral leans;Sit to/from stand Lower Body Dressing Details (indicate cue type and reason): total A for socks,  able to sit<>stand with someone else managing clothing Toilet Transfer: Minimal assistance;RW Toilet Transfer Details (indicate cue type and reason): sit<>stand only this session Toileting- Clothing Manipulation and Hygiene: Maximal assistance Toileting - Clothing Manipulation Details (indicate cue type and  reason): currently with condom cath, and requires heavy BUE support in standing Tub/ Shower Transfer: Maximal assistance   Functional mobility during ADLs: Moderate assistance;Rolling walker;Cueing for safety;Cueing for sequencing General ADL Comments: slow processing, cues for safety and initiation of tasks     Vision Baseline Vision/History: Wears glasses Wears Glasses: At all times       Perception     Praxis      Pertinent Vitals/Pain Pain Assessment: Faces Faces Pain Scale: Hurts little more Pain Location: BLE with pressure/lifting Pain Descriptors / Indicators: Moaning;Tender Pain Intervention(s): Limited activity within patient's tolerance;Monitored during session;Repositioned;Other (comment)(elevation)     Hand Dominance     Extremity/Trunk Assessment Upper Extremity Assessment Upper Extremity Assessment: Generalized weakness   Lower Extremity Assessment Lower Extremity Assessment: Generalized weakness(BLE edema)       Communication Communication Communication: Expressive difficulties(seems to have trouble finding his words)   Cognition Arousal/Alertness: Awake/alert Behavior During Therapy: WFL for tasks assessed/performed;Flat affect Overall Cognitive Status: No family/caregiver present to determine baseline cognitive functioning Area of Impairment: Following commands;Problem solving;Attention                   Current Attention Level: Selective   Following Commands: Follows one step commands with increased time     Problem Solving: Slow processing;Decreased initiation;Requires verbal cues General Comments: Pt unable to tell me what he did before he retired, when I asked what he likes to do he answered "this"   General Comments  Pt RR increased and DOE 2/4 however SpO2 remained at 97-100 throughout session    Exercises     Shoulder Instructions      Home Living Family/patient expects to be discharged to:: Private residence Living  Arrangements: Children(Daughter) Available Help at Discharge: Family Type of Home: House Home Access: Stairs to enter [employer] of Steps: ?   Home Layout: One level     Bathroom Shower/Tub: Teacher, early years/pre: Standard     Home Equipment: Environmental consultant - 2 wheels   Additional Comments: Pt unreliable historian and no family present - unsure if above information is correct      Prior Functioning/Environment Level of Independence: Independent with assistive device(s)        Comments: used a RW to assist with mobility, Pt reports that he bathed and dressed himself and did not use any DME in the shower        OT Problem List: Decreased strength;Decreased activity tolerance;Impaired balance (sitting and/or standing);Decreased cognition;Decreased safety awareness;Decreased knowledge of use of DME or AE;Decreased knowledge of precautions;Pain;Increased edema      OT Treatment/Interventions: Self-care/ADL training;Therapeutic exercise;Energy conservation;DME and/or AE instruction;Therapeutic activities;Patient/family education;Balance training    OT Goals(Current goals can be found in the care plan section) Acute Rehab OT Goals Patient Stated Goal: none stated ADL Goals Pt Will Perform Grooming: with set-up;sitting Pt Will Perform Upper Body Dressing: with set-up;sitting Pt Will Perform Lower Body Dressing: with min guard assist;sit to/from stand Pt Will Transfer to Toilet: with min guard assist;stand pivot transfer;bedside commode Pt Will Perform Toileting - Clothing Manipulation and hygiene: with supervision;sitting/lateral leans Pt Will Perform Tub/Shower Transfer: with min guard assist;3 in 1;rolling walker Additional ADL Goal #1: Pt will recall 3 ways to conserve energy during ADL  with one or less cues  OT Frequency: Min 2X/week   Barriers to D/C:    unsure if 24/7 support available at this time       Co-evaluation              AM-PAC OT "6  Clicks" Daily Activity     Outcome Measure Help from another person eating meals?: A Little Help from another person taking care of personal grooming?: A Little Help from another person toileting, which includes using toliet, bedpan, or urinal?: A Lot Help from another person bathing (including washing, rinsing, drying)?: A Lot Help from another person to put on and taking off regular upper body clothing?: A Lot Help from another person to put on and taking off regular lower body clothing?: A Lot 6 Click Score: 14   End of Session Equipment Utilized During Treatment: Gait belt;Rolling walker Nurse Communication: Mobility status  Activity Tolerance: Patient limited by fatigue Patient left: in bed;with call bell/phone within reach;with bed alarm set;Other (comment)(legs elevated)  OT Visit Diagnosis: Unsteadiness on feet (R26.81);Other abnormalities of gait and mobility (R26.89);Repeated falls (R29.6);Muscle weakness (generalized) (M62.81);Dizziness and giddiness (R42)                Time: [id number] OT Time Calculation (min): 38 min Charges:  OT General Charges $OT Visit: 1 Visit OT Evaluation $OT Eval Moderate Complexity: 1 Mod OT Treatments $Therapeutic Activity: 8-22 mins  Hulda Humphrey OTR/L Acute Rehabilitation Services Pager: [phone] Office: [place] 05/28/2019, 1:22 PM

## 2019-05-28 NOTE — Progress Notes (Signed)
PT Cancellation Note  Patient Details Name: Darius Ellis MRN: [id number] DOB: 1941-02-28   Cancelled Treatment:     PT order received but eval deferred.  Pt exhausted from working with OT in am and for Paracentesis in pm.  Will follow.  Debe Coder PT Acute Rehabilitation Services Pager [phone] Office [phone]    [name] 05/28/2019, 1:31 PM

## 2019-05-28 NOTE — Progress Notes (Signed)
PALLIATIVE NOTE:  Referral received for goals of care discussion. I was able to speak with Mr. Vorwald daughter Vaughan Basta, who was currently at the bedside. I introduced myself and Palliative Medicine's role in patient's care. Daughter verbalized understanding.   Mrs. Spiker has requested for [place] discussion to take place tomorrow 05/29/2019 @ 1500.   Request confirmed and family aware I will be at patient's bedside as requested tomorrow for meeting.   Detailed note and recommendations to follow completed [place] meeting.   Thank you for your referral and allowing Palliative to assist in Mr. Defilippo's care.   Alda Lea, AGPCNP-BC Palliative Medicine Team  Phone: [phone] Pager: [phone] Amion: [place]   NO CHARGE

## 2019-05-29 ENCOUNTER — Other Ambulatory Visit (HOSPITAL_COMMUNITY): Payer: Self-pay | Admitting: Radiology

## 2019-05-29 ENCOUNTER — Ambulatory Visit: Payer: Managed Care, Other (non HMO) | Admitting: Family Medicine

## 2019-05-29 ENCOUNTER — Inpatient Hospital Stay (HOSPITAL_COMMUNITY): Payer: Managed Care, Other (non HMO)

## 2019-05-29 ENCOUNTER — Telehealth: Payer: Self-pay | Admitting: Oncology

## 2019-05-29 ENCOUNTER — Inpatient Hospital Stay: Payer: Self-pay

## 2019-05-29 DIAGNOSIS — R0602 Shortness of breath: Secondary | ICD-10-CM | POA: Diagnosis present

## 2019-05-29 DIAGNOSIS — R918 Other nonspecific abnormal finding of lung field: Secondary | ICD-10-CM

## 2019-05-29 DIAGNOSIS — R930 Abnormal findings on diagnostic imaging of skull and head, not elsewhere classified: Secondary | ICD-10-CM

## 2019-05-29 DIAGNOSIS — R188 Other ascites: Secondary | ICD-10-CM

## 2019-05-29 DIAGNOSIS — C787 Secondary malignant neoplasm of liver and intrahepatic bile duct: Principal | ICD-10-CM

## 2019-05-29 DIAGNOSIS — R509 Fever, unspecified: Secondary | ICD-10-CM

## 2019-05-29 DIAGNOSIS — R601 Generalized edema: Secondary | ICD-10-CM | POA: Diagnosis present

## 2019-05-29 DIAGNOSIS — I82402 Acute embolism and thrombosis of unspecified deep veins of left lower extremity: Secondary | ICD-10-CM

## 2019-05-29 DIAGNOSIS — C801 Malignant (primary) neoplasm, unspecified: Secondary | ICD-10-CM

## 2019-05-29 LAB — CBC WITH DIFFERENTIAL/PLATELET
Abs Immature Granulocytes: 0.08 10*3/uL — ABNORMAL HIGH (ref 0.00–0.07)
Basophils Absolute: 0 10*3/uL (ref 0.0–0.1)
Basophils Relative: 0 %
Eosinophils Absolute: 0 10*3/uL (ref 0.0–0.5)
Eosinophils Relative: 0 %
HCT: 37 % — ABNORMAL LOW (ref 39.0–52.0)
Hemoglobin: 11.9 g/dL — ABNORMAL LOW (ref 13.0–17.0)
Immature Granulocytes: 1 %
Lymphocytes Relative: 5 %
Lymphs Abs: 0.4 10*3/uL — ABNORMAL LOW (ref 0.7–4.0)
MCH: 29.2 pg (ref 26.0–34.0)
MCHC: 32.2 g/dL (ref 30.0–36.0)
MCV: 90.7 fL (ref 80.0–100.0)
Monocytes Absolute: 0.1 10*3/uL (ref 0.1–1.0)
Monocytes Relative: 1 %
Neutro Abs: 7.5 10*3/uL (ref 1.7–7.7)
Neutrophils Relative %: 93 %
Platelets: 138 10*3/uL — ABNORMAL LOW (ref 150–400)
RBC: 4.08 MIL/uL — ABNORMAL LOW (ref 4.22–5.81)
RDW: 17.3 % — ABNORMAL HIGH (ref 11.5–15.5)
WBC: 8.2 10*3/uL (ref 4.0–10.5)
nRBC: 0 % (ref 0.0–0.2)

## 2019-05-29 LAB — COMPREHENSIVE METABOLIC PANEL
ALT: 32 U/L (ref 0–44)
AST: 50 U/L — ABNORMAL HIGH (ref 15–41)
Albumin: 2.3 g/dL — ABNORMAL LOW (ref 3.5–5.0)
Alkaline Phosphatase: 272 U/L — ABNORMAL HIGH (ref 38–126)
Anion gap: 10 (ref 5–15)
BUN: 31 mg/dL — ABNORMAL HIGH (ref 8–23)
CO2: 21 mmol/L — ABNORMAL LOW (ref 22–32)
Calcium: 8.4 mg/dL — ABNORMAL LOW (ref 8.9–10.3)
Chloride: 105 mmol/L (ref 98–111)
Creatinine, Ser: 0.98 mg/dL (ref 0.61–1.24)
GFR calc Af Amer: >60 mL/min (ref >60)
GFR calc non Af Amer: >60 mL/min (ref >60)
Glucose, Bld: 101 mg/dL — ABNORMAL HIGH (ref 70–99)
Potassium: 3.5 mmol/L (ref 3.5–5.1)
Sodium: 136 mmol/L (ref 135–145)
Total Bilirubin: 2.1 mg/dL — ABNORMAL HIGH (ref 0.3–1.2)
Total Protein: 4.6 g/dL — ABNORMAL LOW (ref 6.5–8.1)

## 2019-05-29 LAB — MAGNESIUM: Magnesium: 2.1 mg/dL (ref 1.7–2.4)

## 2019-05-29 LAB — GRAM STAIN: Gram Stain: NONE SEEN

## 2019-05-29 LAB — PHOSPHORUS: Phosphorus: 2.6 mg/dL (ref 2.5–4.6)

## 2019-05-29 MED ORDER — SODIUM CHLORIDE 0.9% FLUSH
10.0000 mL | INTRAVENOUS | Status: DC | PRN
  Administered 2019-06-01: 10 mL

## 2019-05-29 MED ORDER — SODIUM CHLORIDE 0.9% FLUSH: 10.0000 mL | Freq: Two times a day (BID) | INTRAVENOUS | Status: DC

## 2019-05-29 MED ORDER — ADULT MULTIVITAMIN W/MINERALS CH
1.0000 | ORAL_TABLET | Freq: Every day | ORAL | Status: DC
  Administered 2019-05-30 – 2019-06-02 (×4): 1 via ORAL
  Filled 2019-05-29 (×4): qty 1

## 2019-05-29 MED ORDER — PRO-STAT SUGAR FREE PO LIQD
30.0000 mL | Freq: Four times a day (QID) | ORAL | Status: DC
  Administered 2019-05-29 – 2019-06-02 (×11): 30 mL via ORAL
  Filled 2019-05-29 (×11): qty 30

## 2019-05-29 MED ORDER — FUROSEMIDE 10 MG/ML IJ SOLN
20.0000 mg | Freq: Once | INTRAMUSCULAR | Status: AC
  Administered 2019-05-29: 20 mg via INTRAVENOUS
  Filled 2019-05-29: qty 2

## 2019-05-29 NOTE — Progress Notes (Signed)
Initial Nutrition Assessment  RD working remotely.   DOCUMENTATION CODES:   Not applicable  INTERVENTION:  - will order 30 mL Prostat QID, each supplement provides 100 kcal and 15 grams of protein. - will order daily multivitamin with minerals. - will complete NFPE and obtain nutrition-related information at follow-up.    NUTRITION DIAGNOSIS:   Increased nutrient needs related to cancer and cancer related treatments, catabolic illness, chronic illness as evidenced by estimated needs.  GOAL:   Patient will meet greater than or equal to 90% of their needs  MONITOR:   PO intake, Labs, Weight trends, I & O's  REASON FOR ASSESSMENT:   Malnutrition Screening Tool  ASSESSMENT:   78 y.o. male with medical history significant of metastatic liver cancer who has received recent chemotherapy. Patient presented to the ED after a syncopal episode with associated fall at home. He was experiencing some SOB at rest at home. Per his daughter's (who is a Marine scientist) report, he had been declining for several days PTA.  Flow sheet documentation indicates patient ate 0% of dinner last night. Patient out of the room for earlier today and notes indicate planned [place] meeting to start at 1500 today. Did not attempt to re-visit or call patient in light of this.   H&P information indicated that patient reported he experiences diarrhea when consuming nutrition supplements; will trial prostat as this is often better tolerated than the other nutrition supplements available here. Also indicated that patient reported he has had a "fair" appetite and that appetite did improve for a short time after previous two paracenteses.   Per chart review, current weight is 183 lb with RN documenting deep pitting edema to BLE. Weight on 11/9 was 195 lb which indicates 12 lb weight loss (6% body weight) in the past 1 month; significant for time frame but unsure how much of this was fluid related given 2 paracenteses performed PTA.    Per notes: - 5L removed during paracentesis yesterday - last paracentesis was 11/30 when 5L also removed - Cardiology consulted--syncope thought to be orthostatic - PT and OT recommending SNF - CT head--no acute intracranial abnormalities - s/p ECHO - dyspnea improves after paracenteses - abdominal distention 2/2 ascites  - BPH with acute urinary retention - AKI - metastatic adenocarcinoma with unknown primary   Labs reviewed; BUN: 31 mg/dl, Ca: 8.4 mg/dl, Alk Phos elevated. Medications reviewed; 25 g albumin x1 dose 12/6, 1 mg folvite/day, 20 mg IV lasix x1 dose 12/7, 100 mg thiamine/day, 1000 mcg oral cyanocobalamin/day.      NUTRITION - FOCUSED PHYSICAL EXAM:  unable to complete at this time.   Diet Order:   Diet Order            Diet regular Room service appropriate? Yes; Fluid consistency: Thin  Diet effective now              EDUCATION NEEDS:   Not appropriate for education at this time  Skin:  Skin Assessment: Reviewed RN Assessment  Last BM:  12/7  Height:   Ht Readings from Last 1 Encounters:  05/27/19 _0  (1.854 m)    Weight:   Wt Readings from Last 1 Encounters:  05/27/19 82.9 kg    Ideal Body Weight:  83.6 kg  BMI:  Body mass index is 24.12 kg/m.  Estimated Nutritional Needs:   Kcal:  2375-2600 kcal  Protein:  120-130 grams  Fluid:  >/= 1.5 L/day      Jarome Matin, MS, RD, LDN,  CNSC Inpatient Clinical Dietitian Pager # [phone] After hours/weekend pager # [phone]

## 2019-05-29 NOTE — Progress Notes (Addendum)
HEMATOLOGY-ONCOLOGY PROGRESS NOTE  SUBJECTIVE: [name] presented to the emergency room following a syncopal episode.  He had fallen and the bathroom at home.  He had a CT of the head performed which showed no acute intracranial abnormalities.  He had a CT angiogram of the chest which showed no central, segmental, or subsegmental pulmonary embolism, small bilateral pleural effusions, left greater than right, partially visualized multiple hypodense liver lesions, moderate abdominal ascites.  He had elevated troponins on admission and was noted to be hypotensive and prerenal and cardiology was consulted.  Recommended continued anticoagulation. Also questioned whether current chemotherapy regimen could be contributing to his symptoms.  He underwent an ultrasound-guided paracentesis on 05/28/2019 with 5 L of fluid removed.  Fluid sent for cytology which is pending.  Today, the patient reports he is feeling fine except for some mild weakness.  PT and OT consults have been ordered but not yet completed. Abdominal distention is much better following paracentesis.  Denies nausea vomiting.  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 [facility])   05/12/2019 -  Chemotherapy   The patient had PACLitaxel-protein bound (ABRAXANE) chemo infusion 200 mg, 100 mg/m2 = 200 mg (100 % of original dose 100 mg/m2), Intravenous,  Once, 1 of 4 cycles Dose modification: 100 mg/m2 (original dose 100 mg/m2, Cycle 1, Reason: Provider Judgment) Administration: 200 mg (05/12/2019), 200 mg (05/26/2019) gemcitabine (GEMZAR) 1,672 mg in sodium chloride 0.9 % 250 mL chemo infusion, 800 mg/m2 = 1,672 mg (100 % of original dose 800 mg/m2), Intravenous,  Once, 1 of 4 cycles Dose modification: 800 mg/m2 (original dose 800 mg/m2, Cycle 1, Reason: Provider Judgment) Administration: 1,672 mg (05/12/2019), 1,672  mg (05/26/2019)  for chemotherapy treatment.      PHYSICAL EXAMINATION:  Vitals:   05/28/19 2051 05/29/19 0430  BP: 116/69 111/65  Pulse: 89 76  Resp: 18 18  Temp: 98 F (36.7 C) 98.7 F (37.1 C)  SpO2: 97% 97%   Filed Weights   05/27/19 0035  Weight: 182 lb 12.9 oz (82.9 kg)    Intake/Output from previous day: 12/06 0701 - 12/07 0700 In: 27 [P.O.:260] Out: 1201 [Urine:1200; Stool:1]  GENERAL:alert, no distress and comfortable OROPHARYNX: No thrush or mucositis LUNGS: Diminished breath sounds at the right posterior lung base, no respiratory distress HEART: regular rate & rhythm, pitting edema to the bilateral lower extremities, left greater than right ABDOMEN: Positive bowel sounds, mild distention, liver palpable in the right upper abdomen NEURO: alert & oriented x 3 with fluent speech, no focal motor/sensory deficits  LABORATORY DATA:  I have reviewed the data as listed CMP Latest Ref Rng & Units 05/29/2019 05/28/2019 05/27/2019  Glucose 70 - 99 mg/dL 101(H) 112(H) 111(H)  BUN 8 - 23 mg/dL 31(H) 37(H) 32(H)  Creatinine 0.61 - 1.24 mg/dL 0.98 1.41(H) 1.40(H)  Sodium 135 - 145 mmol/L 136 137 135  Potassium 3.5 - 5.1 mmol/L 3.5 3.6 4.2  Chloride 98 - 111 mmol/L 105 104 106  CO2 22 - 32 mmol/L 21(L) 23 -  Calcium 8.9 - 10.3 mg/dL 8.4(L) 8.4(L) -  Total Protein 6.5 - 8.1 g/dL 4.6(L) 4.8(L) 5.2(L)  Total Bilirubin 0.3 - 1.2 mg/dL 2.1(H) 1.5(H) 1.2  Alkaline Phos 38 - 126 U/L 272(H) 236(H) 268(H)  AST 15 - 41 U/L 50(H) 40 52(H)  ALT 0 - 44 U/L 32 31 40    Lab Results  Component  Value Date   WBC 8.2 05/29/2019   HGB 11.9 (L) 05/29/2019   HCT 37.0 (L) 05/29/2019   MCV 90.7 05/29/2019   PLT 138 (L) 05/29/2019   NEUTROABS 7.5 05/29/2019    Ct Head Wo Contrast  Result Date: 05/27/2019 CLINICAL DATA:  Lightheadedness.  Syncope. EXAM: CT HEAD WITHOUT CONTRAST TECHNIQUE: Contiguous axial images were obtained from the base of the skull through the vertex without  intravenous contrast. COMPARISON:  April 10, 2019 FINDINGS: Brain: No subdural, epidural, or subarachnoid hemorrhage. Cerebellum, brainstem, and basal sys cisterns are normal. 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 No acute cortical ischemia infarct noted. No mass effect or midline shift. Ventricles and sulci are stable. Vascular: No hyperdense vessel or unexpected calcification. Skull: Normal. Negative for fracture or focal lesion. Sinuses/Orbits: No acute finding. Other: None. IMPRESSION: No acute intracranial abnormalities. Electronically Signed   By: Dorise Bullion III M.D   On: 05/27/2019 03:17   Ct Chest W Contrast  Addendum Date: 05/12/2019   ADDENDUM REPORT: 05/12/2019 10:36 ADDENDUM: Left-sided Port-A-Cath appears to terminate in the low SVC. It is a small bore catheter and is somewhat obscured by the contrast bolus, however. If further evaluation is desired, PA and lateral views of the chest are recommended. Electronically Signed   By: Lorin Picket M.D.   On: 05/12/2019 10:36   Addendum Date: 05/04/2019   ADDENDUM REPORT: 05/04/2019 12:09 ADDENDUM: Outside CT abdomen/pelvis dated 02/01/2019 submitted for comparison. The liver lesions have significantly increased in number and are stable to increased in size since 02/01/2019 CT, compatible with progression of metastatic disease to the liver. Representative central right liver 7.1 x 3.8 cm mass (series 2/image 65), increased from 4.0 x 2.6 cm. Representative segment 4A left liver lobe 6.9 x 6.3 cm mass (series 2/image 56), previously 6.9 x 6.4 cm, stable. Representative segment 2 left liver lobe 3.5 x 2.8 cm mass (series 2/image 52), previously 3.6 x 2.4 cm, stable. Representative new right liver dome 2.0 x 2.0 cm mass (series 2/image 46). Representative inferior right liver lobe 3.8 x 3.1 cm mass (series 2/image 64), increased from 2.1 x 2.0 cm. The enlarged gastrohepatic ligament, portacaval, left para-aortic and aortocaval  lymph nodes appear stable. The anasarca, ascites and pleural effusions are essentially new. Electronically Signed   By: Ilona Sorrel M.D.   On: 05/04/2019 12:09   Result Date: 05/12/2019 CLINICAL DATA:  Metastatic adenocarcinoma of unknown primary, reportedly most likely pancreatic or biliary or upper GI origin per 11/30/2018 liver mass biopsy. Status post chemotherapy, left liver lobe radioembolization 03/03/2019 and right liver lobe radioembolization 03/31/2019. Restaging. [place]. EXAM: CT CHEST, ABDOMEN, AND PELVIS WITH CONTRAST TECHNIQUE: Multidetector CT imaging of the chest, abdomen and pelvis was performed following the standard protocol during bolus administration of intravenous contrast. CONTRAST:  164m OMNIPAQUE IOHEXOL 300 MG/ML  SOLN COMPARISON:  None available at the time of this interpretation. FINDINGS: CT CHEST FINDINGS Cardiovascular: Normal heart size. No significant pericardial effusion/thickening. Three-vessel coronary atherosclerosis. Atherosclerotic nonaneurysmal thoracic aorta. Normal caliber pulmonary arteries. No central pulmonary emboli. Mediastinum/Nodes: Hypodense 1.4 cm left thyroid nodule. Unremarkable esophagus. No pathologically enlarged axillary, mediastinal or hilar lymph nodes. Lungs/Pleura: No pneumothorax. Small dependent right pleural effusion. Moderate dependent left pleural effusion. Moderate passive dependent left lower lobe atelectasis. Mild passive dependent right lower lobe and lingular atelectasis. No acute consolidative airspace disease, lung masses or significant pulmonary nodules. Musculoskeletal: Mild thoracic spondylosis. Small sclerotic foci in the  lateral right ninth through twelfth ribs, with the appearance of nearly healed fractures. CT ABDOMEN PELVIS FINDINGS Hepatobiliary: There are numerous (greater than 20) similar peripherally enhancing liver masses scattered throughout the liver. Representative 7.1 x 3.8 cm central  right liver mass (series 2/image 65). Representative 6.9 x 6.3 cm segment 4A left liver lobe mass (series 2/image 56). Representative 3.4 x 2.7 cm segment 2 left liver lobe mass (series 2/image 53). Cholecystectomy. No biliary ductal dilatation. Pancreas: Normal, with no mass or duct dilation. Spleen: Normal size. No mass. Adrenals/Urinary Tract: Normal adrenals. Subcentimeter hypodense right renal cortical lesions are too small to characterize. Otherwise normal kidneys, with no hydronephrosis. Nondistended bladder obscured by streak artifact from left hip hardware. Stomach/Bowel: Normal non-distended stomach. Normal caliber small bowel with no small bowel wall thickening. Appendix not discretely visualized. Oral contrast transits to the colon. Normal large bowel with no diverticulosis, large bowel wall thickening or pericolonic fat stranding. Vascular/Lymphatic: Atherosclerotic nonaneurysmal abdominal aorta. Patent portal, splenic, hepatic and renal veins. Enlarged gastrohepatic ligament nodes up to 1.2 cm (series 2/image 61). Enlarged 1.5 cm portacaval node (series 2/image 69). Enlarged 1.2 cm left para-aortic node (series 2/ image 72). Enlarged 1.2 cm aortocaval node (series 2/image 76). No pelvic adenopathy. Reproductive: Top-normal size prostate, obscured by streak artifact. Other: Moderate to large volume ascites. No focal fluid collection. Ascitic fluid trapped within small right inguinal hernia sac. Mild anasarca. Musculoskeletal: No aggressive appearing focal osseous lesions. Partially visualized left total hip arthroplasty and surgical fixation hardware in the proximal right femur. Marked lumbar spondylosis. IMPRESSION: 1. Numerous peripherally enhancing liver masses throughout the liver, compatible with viable liver metastases. If the outside prior studies are submitted for comparison, an addendum can be issued at that time. 2. Mildly enlarged lymph nodes throughout the gastrohepatic ligament, portacaval,  aortocaval and left para-aortic change suspicious for metastatic disease. 3. Findings suggesting third-spacing of fluid including mild anasarca, moderate to large volume ascites and moderate left and small right dependent pleural effusions. 4.  Aortic Atherosclerosis (ICD10-I70.0). Electronically Signed: By: Ilona Sorrel M.D. On: 05/02/2019 18:12   Ct Angio Chest Pe W And/or Wo Contrast  Result Date: 05/27/2019 CLINICAL DATA:  Dizziness and syncope EXAM: CT ANGIOGRAPHY CHEST WITH CONTRAST TECHNIQUE: Multidetector CT imaging of the chest was performed using the standard protocol during bolus administration of intravenous contrast. Multiplanar CT image reconstructions and MIPs were obtained to evaluate the vascular anatomy. CONTRAST:  126m OMNIPAQUE IOHEXOL 350 MG/ML SOLN COMPARISON:  May 02, 2019 FINDINGS: Cardiovascular: There is a optimal opacification of the pulmonary arteries. There is no central,segmental, or subsegmental filling defects within the pulmonary arteries. The heart is normal in size. No pericardial effusion or thickening. No evidence right heart strain. There is normal three-vessel brachiocephalic anatomy without proximal stenosis. Scattered aortic atherosclerotic calcifications are seen without aneurysmal dilatation. Mediastinum/Nodes: No hilar, mediastinal, or axillary adenopathy. Again noted is a 1.4 cm low-density lesion within the left thyroid lobe. Lungs/Pleura: There are small bilateral pleural effusions, left greater right. Patchy/streaky airspace opacity seen at both lung bases which could be compressive atelectasis. Upper Abdomen: There is partially visualized multiple hypodense lesions seen throughout the liver. A moderate amount of abdominal ascites is seen. For Musculoskeletal: No chest wall abnormality. No acute or significant osseous findings. Anterior flowing osteophytes seen throughout the thoracic spine. Review of the MIP images confirms the above findings. IMPRESSION: 1.  no central, segmental, or subsegmental pulmonary embolism. 2. Small bilateral pleural effusions, left greater than right with probable adjacent  compressive atelectasis. 3. Partially visualized multiple hypodense liver lesions. 4. Moderate abdominal ascites. Electronically Signed   By: Prudencio Pair M.D.   On: 05/27/2019 03:40   US Abdomen Complete  Result Date: 05/27/2019 CLINICAL DATA:  Acute renal failure, ascites metastatic disease to the liver. EXAM: ABDOMEN ULTRASOUND COMPLETE COMPARISON:  05/27/2019 and 05/02/2019 FINDINGS: Gallbladder: Gallbladder is contracted with wall thickening in the setting of generalized edema. No sonographic Murphy's sign reported by the sonographer. Common bile duct: Diameter: 3 mm Liver: Diffuse heterogeneity of the liver with multiple areas of hypo coexisting metastatic disease. These are better displayed on recent CT evaluations. Portal vein is patent on color Doppler imaging with normal direction of blood flow towards the liver. IVC: No abnormality visualized. Pancreas: Visualized portion unremarkable. Spleen: Size and appearance within normal limits. Right Kidney: Length: 9.5 cm. Cortical thinning. No signs of hydronephrosis. Renal sinus lipomatosis. Hypoechoic renal lesions likely small proteinaceous or hemorrhagic cysts unchanged from recent CT of the chest, abdomen and pelvis. Left Kidney: Length: 11.5 cm. Limited assessment of the left kidney. Visualized portions are normal. No visible hydronephrosis. Abdominal aorta: Distal portion of the aorta at the bifurcation is not well visualized. No signs of aneurysm and visible portions. Other findings: Ascites present in all 4 quadrants. Signs of bowel edema, generalized likely related to underlying anasarca. IMPRESSION: 1. Multiple metastatic lesions in the liver. 2. Signs of anasarca with ascites and generalized edema. 3. No signs of biliary ductal dilation. Electronically Signed   By: Zetta Bills M.D.   On: 05/27/2019  11:09   Ct Abdomen Pelvis W Contrast  Addendum Date: 05/12/2019   ADDENDUM REPORT: 05/12/2019 10:36 ADDENDUM: Left-sided Port-A-Cath appears to terminate in the low SVC. It is a small bore catheter and is somewhat obscured by the contrast bolus, however. If further evaluation is desired, PA and lateral views of the chest are recommended. Electronically Signed   By: Lorin Picket M.D.   On: 05/12/2019 10:36   Addendum Date: 05/04/2019   ADDENDUM REPORT: 05/04/2019 12:09 ADDENDUM: Outside CT abdomen/pelvis dated 02/01/2019 submitted for comparison. The liver lesions have significantly increased in number and are stable to increased in size since 02/01/2019 CT, compatible with progression of metastatic disease to the liver. Representative central right liver 7.1 x 3.8 cm mass (series 2/image 65), increased from 4.0 x 2.6 cm. Representative segment 4A left liver lobe 6.9 x 6.3 cm mass (series 2/image 56), previously 6.9 x 6.4 cm, stable. Representative segment 2 left liver lobe 3.5 x 2.8 cm mass (series 2/image 52), previously 3.6 x 2.4 cm, stable. Representative new right liver dome 2.0 x 2.0 cm mass (series 2/image 46). Representative inferior right liver lobe 3.8 x 3.1 cm mass (series 2/image 64), increased from 2.1 x 2.0 cm. The enlarged gastrohepatic ligament, portacaval, left para-aortic and aortocaval lymph nodes appear stable. The anasarca, ascites and pleural effusions are essentially new. Electronically Signed   By: Ilona Sorrel M.D.   On: 05/04/2019 12:09   Result Date: 05/12/2019 CLINICAL DATA:  Metastatic adenocarcinoma of unknown primary, reportedly most likely pancreatic or biliary or upper GI origin per 11/30/2018 liver mass biopsy. Status post chemotherapy, left liver lobe radioembolization 03/03/2019 and right liver lobe radioembolization 03/31/2019. Restaging. [place]. EXAM: CT CHEST, ABDOMEN, AND PELVIS WITH CONTRAST TECHNIQUE: Multidetector CT  imaging of the chest, abdomen and pelvis was performed following the standard protocol during bolus administration of intravenous contrast. CONTRAST:  138m OMNIPAQUE IOHEXOL 300 MG/ML  SOLN COMPARISON:  None available at the time of this interpretation. FINDINGS: CT CHEST FINDINGS Cardiovascular: Normal heart size. No significant pericardial effusion/thickening. Three-vessel coronary atherosclerosis. Atherosclerotic nonaneurysmal thoracic aorta. Normal caliber pulmonary arteries. No central pulmonary emboli. Mediastinum/Nodes: Hypodense 1.4 cm left thyroid nodule. Unremarkable esophagus. No pathologically enlarged axillary, mediastinal or hilar lymph nodes. Lungs/Pleura: No pneumothorax. Small dependent right pleural effusion. Moderate dependent left pleural effusion. Moderate passive dependent left lower lobe atelectasis. Mild passive dependent right lower lobe and lingular atelectasis. No acute consolidative airspace disease, lung masses or significant pulmonary nodules. Musculoskeletal: Mild thoracic spondylosis. Small sclerotic foci in the lateral right ninth through twelfth ribs, with the appearance of nearly healed fractures. CT ABDOMEN PELVIS FINDINGS Hepatobiliary: There are numerous (greater than 20) similar peripherally enhancing liver masses scattered throughout the liver. Representative 7.1 x 3.8 cm central right liver mass (series 2/image 65). Representative 6.9 x 6.3 cm segment 4A left liver lobe mass (series 2/image 56). Representative 3.4 x 2.7 cm segment 2 left liver lobe mass (series 2/image 53). Cholecystectomy. No biliary ductal dilatation. Pancreas: Normal, with no mass or duct dilation. Spleen: Normal size. No mass. Adrenals/Urinary Tract: Normal adrenals. Subcentimeter hypodense right renal cortical lesions are too small to characterize. Otherwise normal kidneys, with no hydronephrosis. Nondistended bladder obscured by streak artifact from left hip hardware. Stomach/Bowel: Normal  non-distended stomach. Normal caliber small bowel with no small bowel wall thickening. Appendix not discretely visualized. Oral contrast transits to the colon. Normal large bowel with no diverticulosis, large bowel wall thickening or pericolonic fat stranding. Vascular/Lymphatic: Atherosclerotic nonaneurysmal abdominal aorta. Patent portal, splenic, hepatic and renal veins. Enlarged gastrohepatic ligament nodes up to 1.2 cm (series 2/image 61). Enlarged 1.5 cm portacaval node (series 2/image 69). Enlarged 1.2 cm left para-aortic node (series 2/ image 72). Enlarged 1.2 cm aortocaval node (series 2/image 76). No pelvic adenopathy. Reproductive: Top-normal size prostate, obscured by streak artifact. Other: Moderate to large volume ascites. No focal fluid collection. Ascitic fluid trapped within small right inguinal hernia sac. Mild anasarca. Musculoskeletal: No aggressive appearing focal osseous lesions. Partially visualized left total hip arthroplasty and surgical fixation hardware in the proximal right femur. Marked lumbar spondylosis. IMPRESSION: 1. Numerous peripherally enhancing liver masses throughout the liver, compatible with viable liver metastases. If the outside prior studies are submitted for comparison, an addendum can be issued at that time. 2. Mildly enlarged lymph nodes throughout the gastrohepatic ligament, portacaval, aortocaval and left para-aortic change suspicious for metastatic disease. 3. Findings suggesting third-spacing of fluid including mild anasarca, moderate to large volume ascites and moderate left and small right dependent pleural effusions. 4.  Aortic Atherosclerosis (ICD10-I70.0). Electronically Signed: By: Ilona Sorrel M.D. On: 05/02/2019 18:12   US Paracentesis  Result Date: 05/28/2019 INDICATION: Patient with history of metastatic adenocarcinoma of unknown primary, left lower extremity DVT, recurrent ascites; request received for diagnostic and therapeutic paracentesis. EXAM:  ULTRASOUND GUIDED DIAGNOSTIC AND THERAPEUTIC PARACENTESIS MEDICATIONS: None COMPLICATIONS: None immediate. PROCEDURE: Informed written consent was obtained from the patient after a discussion of the risks, benefits and alternatives to treatment. A timeout was performed prior to the initiation of the procedure. Initial ultrasound scanning demonstrates a large amount of ascites within the right mid to lower abdominal quadrant. The right mid to lower abdomen was prepped and draped in the usual sterile fashion. 1% lidocaine was used for local anesthesia. Following this, a 19 gauge, 10-cm, Yueh catheter was introduced. An ultrasound image was saved for documentation purposes. The paracentesis was performed. The catheter was  removed and a dressing was applied. The patient tolerated the procedure well without immediate post procedural complication. Patient was administered 25 grams IV albumin by floor nurse on unit FINDINGS: A total of approximately 5 liters of slightly hazy, yellow fluid was removed. Samples were sent to the laboratory as requested by the clinical team. IMPRESSION: Successful ultrasound-guided diagnostic and therapeutic paracentesis yielding 5 liters of peritoneal fluid. Read by: Rowe Robert, PA-C Electronically Signed   By: Lucrezia Europe M.D.   On: 05/28/2019 14:52   US Paracentesis  Result Date: 05/22/2019 INDICATION: Patient with history of metastatic adenocarcinoma of unknown primary with ascites. Request was made for therapeutic and diagnostic paracentesis up to 5 L maximum EXAM: ULTRASOUND GUIDED THERAPEUTIC AND DIAGNOSTIC PARACENTESIS MEDICATIONS: None. COMPLICATIONS: None immediate. PROCEDURE: Informed written consent was obtained from the patient after a discussion of the risks, benefits and alternatives to treatment. A timeout was performed prior to the initiation of the procedure. Initial ultrasound scanning demonstrates a large amount of ascites within the right lower abdominal quadrant.  The right lower abdomen was prepped and draped in the usual sterile fashion. 1% lidocaine was used for local anesthesia. Following this, a 19 gauge, 7-cm, Yueh catheter was introduced. An ultrasound image was saved for documentation purposes. The paracentesis was performed. The catheter was removed and a dressing was applied. The patient tolerated the procedure well without immediate post procedural complication. FINDINGS: A total of approximately 5 L of straw colored fluid was removed. Samples were sent to the laboratory as requested by the clinical team. IMPRESSION: Successful ultrasound-guided therapeutic and diagnostic paracentesis yielding 5 liters of peritoneal fluid. Read by Rushie Nyhan NP Electronically Signed   By: Sandi Mariscal M.D.   On: 05/22/2019 11:01   US Paracentesis  Result Date: 05/05/2019 INDICATION: Patient with history of metastatic adenocarcinoma of unknown primary, ascites. Request made for diagnostic and therapeutic paracentesis up to 5 liters. EXAM: ULTRASOUND GUIDED DIAGNOSTIC AND THERAPEUTIC PARACENTESIS MEDICATIONS: None COMPLICATIONS: None immediate. PROCEDURE: Informed written consent was obtained from the patient after a discussion of the risks, benefits and alternatives to treatment. A timeout was performed prior to the initiation of the procedure. Initial ultrasound scanning demonstrates a large amount of ascites within the left mid to lower abdominal quadrant. The left mid to lower abdomen was prepped and draped in the usual sterile fashion. 1% lidocaine was used for local anesthesia. Following this, a 19 gauge, 7-cm, Yueh catheter was introduced. An ultrasound image was saved for documentation purposes. The paracentesis was performed. The catheter was removed and a dressing was applied. The patient tolerated the procedure well without immediate post procedural complication. FINDINGS: A total of approximately 5 liters of yellow fluid was removed. Samples were sent to the  laboratory as requested by the clinical team. IMPRESSION: Successful ultrasound-guided diagnostic and therapeutic paracentesis yielding 5 liters of peritoneal fluid. Read by: Rowe Robert, PA-C Electronically Signed   By: Sandi Mariscal M.D.   On: 05/05/2019 16:51   Dg Chest Port 1 View  Result Date: 05/29/2019 CLINICAL DATA:  Shortness of breath EXAM: PORTABLE CHEST 1 VIEW COMPARISON:  Chest CT from 2 days ago FINDINGS: Normal heart size and unremarkable mediastinal contours. Left-sided port with tip in good position. Bands of opacity with haziness and costophrenic sulcus blunting greater on the left. No edema or pneumothorax. IMPRESSION: Atelectasis and small pleural effusions. Aeration has improved from 2 days ago. Electronically Signed   By: Monte Fantasia M.D.   On: 05/29/2019 05:16  Dg Chest Portable 1 View  Result Date: 05/27/2019 CLINICAL DATA:  Dizziness and syncope EXAM: PORTABLE CHEST 1 VIEW COMPARISON:  None. FINDINGS: There is mild cardiomegaly. A left-sided MediPort catheter is seen with the tip at the lower SVC. There is moderate left and small right pleural effusion. Hazy airspace opacity seen adjacent to lower lungs which is likely layering effusion and/or compressive atelectasis. No acute osseous abnormality. IMPRESSION: Moderate left and small right pleural effusion with probable adjacent layering effusion and/or atelectasis. Electronically Signed   By: Prudencio Pair M.D.   On: 05/27/2019 01:00   Vas [place] Lower Extremity Venous (dvt)  Result Date: 05/12/2019  Lower Venous Study Indications: Swelling.  Risk Factors: Cancer. Limitations: Poor ultrasound/tissue interface and Ascites. Comparison Study: No prior studies. Performing Technologist: Oliver Hum RVT  Examination Guidelines: A complete evaluation includes B-mode imaging, spectral Doppler, color Doppler, and power Doppler as needed of all accessible portions of each vessel. Bilateral testing is considered an integral part of a  complete examination. Limited examinations for reoccurring indications may be performed as noted.  +-----+---------------+---------+-----------+----------+--------------+ RIGHTCompressibilityPhasicitySpontaneityPropertiesThrombus Aging +-----+---------------+---------+-----------+----------+--------------+ CFV  Full           Yes      Yes                                 +-----+---------------+---------+-----------+----------+--------------+   +---------+---------------+---------+-----------+----------+--------------+ LEFT     CompressibilityPhasicitySpontaneityPropertiesThrombus Aging +---------+---------------+---------+-----------+----------+--------------+ CFV      Partial        No       No                   Acute          +---------+---------------+---------+-----------+----------+--------------+ FV Prox  None                                         Acute          +---------+---------------+---------+-----------+----------+--------------+ FV Mid   None                                         Acute          +---------+---------------+---------+-----------+----------+--------------+ FV DistalNone                                         Acute          +---------+---------------+---------+-----------+----------+--------------+ PFV      Full                                                        +---------+---------------+---------+-----------+----------+--------------+ POP      None           No       No                   Acute          +---------+---------------+---------+-----------+----------+--------------+ PTV      None  Acute          +---------+---------------+---------+-----------+----------+--------------+ PERO     None                                         Acute          +---------+---------------+---------+-----------+----------+--------------+ EIV      None           No       No                    Acute          +---------+---------------+---------+-----------+----------+--------------+ Unable to visualize the common iliac vein, and inferior vena cava due to ascites.    Summary: Right: There is no evidence of deep vein thrombosis in the lower extremity. Left: Findings consistent with acute deep vein thrombosis involving the left external iliac vein, common femoral vein, left femoral vein, left popliteal vein, left posterior tibial veins, and left peroneal veins. No cystic structure found in the popliteal fossa.  *See table(s) above for measurements and observations. Electronically signed by Servando Snare MD on 05/12/2019 at 5:43:03 PM.    Final     ASSESSMENT AND PLAN: 1. Metastatic adenocarcinoma unknown primary  CTs6/01/2019-large mass left hepatic lobe measuring 9.1 cm, few ill-defined lesions in the right hepatic lobe measuring up to 9 mm. Indeterminate gastrohepatic ligament lymph node measuring 1.1 cm. Shotty periportal and right pericardial adenopathy.  Biopsy liver lesion6/03/2019 -adenocarcinoma unknown primary most likely pancreatobiliary tree(including intrahepatic cholangiocarcinoma)and upper GI tract;CK7 positive; CK20, TTF-1, CDX2, GATA 3, p63 and PSA negative  Colonoscopy 12/09/2018-internal hemorrhoids, mild. Examination otherwise normal.  Upper endoscopy 12/09/2018-examined duodenum normal. Diffuse mild inflammation characterized by congestion and erythema in the gastric antrum.Biopsy showed chronic active gastritis with intestinal metaplasia; immunohistochemical staining positive for H pylori.  CT chest 12/13/2018-heterogeneous hypodense nodule left thyroid lobe. Previously reported small bilateral pleural effusions have resolved. Trace pericardial effusion. Interval development of several small lymph nodes in the right anterior cardiophrenic space measuring up to 1.2 x 0.8 cm. Also mildly prominent lymph nodes in the periceliac region, gastrohepatic  ligament and periportal area.  12/13/2018 CEA 8.2   12/28/2018 PET scan-multiple intensely FDG avid hepatic metastases. FDG avid anterior cardiophrenic, periportal, periceliac and gastrohepatic ligament lymph nodes. New trace right pleural effusion. Stable trace pericardial effusion.  Cycle 1 FOLFOX 01/10/2019   Cycle 2 FOLFOX 01/24/2019   01/24/2019 CEA 13.3, CA 19-9 163  CTs 02/01/2019-multiple confluent liver lesions noted occupying nearly the entire left lobe. More separated lesions in the right lobe have increased in size. At least 10-12 enlarging/new lesions in the right lobe of the liver.  Cycle 3 FOLFOX 02/07/2019  03/03/2019-left hepatic Y90  03/31/2019-right hepatic Y90  CTs 05/02/2019-liver lesions have significantly increased in number; othersare stable to increased in size since 02/01/2019 CT. Enlarged gastrohepatic ligament, portacaval, left periaortic and aortocaval lymph nodes appear stable. Anasarca, ascites and pleural effusions are essentially new.  Guardant 360 testing 05/12/2019- PIK3CA, IDH2 alterations, MSI high not detected, tumor mutation burden 8.57, ATM Vus  Cycle 1 gemcitabine/Abraxane 05/12/2019  Cycle 2 gemcitabine/Abraxane 05/26/2019 2. BPH 3. Hearing loss 4. Neuropathy 5. History of H. pylori status post treatment 6. History of atrial fibrillation 7. Asymmetric leg edema-referred for Doppler 8. Ascites-paracentesis 05/05/2019 with 5 L removed, cytology with reactive mesothelial cells, paracentesis 05/28/2019 with 5 L removed, cytology pending. 9. Left  lower extremity DVT confirmed on Doppler 05/12/2019-apixaban started 10. Hospital admission 05/27/2019 - syncope  Mr. Mcfadden has been admitted following a syncopal episode.  He is still weak but reports that he is feeling better.  PT and OT consults are pending.  Status post paracentesis with improvement of his abdominal distention.  Cytology is pending.  The patient has expressed that he is not interested  in pursuing hospice and would like additional chemotherapy as an outpatient.  Recommendations: 1.  Proceed with PT and OT consult and await recommendations. 2.  We will follow-up on cytology from paracentesis. 3.  The patient may discharge home when otherwise medically stable from our standpoint.  We will plan to see him in the office as previously scheduled to discuss further chemotherapy.    LOS: 2 days   Mikey Bussing, DNP, AGPCNP-BC, AOCNP 05/29/19 Mr. Norgren was interviewed and examined.  He was admitted on 05/27/2019 after a syncope event.  The syncope was likely related to severe deconditioning, malnutrition, and dehydration.  I doubt the symptoms were directly related to the chemotherapy regimen.  These drugs are not typically associated with cardiac toxicity.  I discussed disposition plans with Mr. Cherene Julian.  His daughter was not present this morning.  We discussed hospice care.  He would like to continue the trial of chemotherapy if his performance status allows when he returns for an office visit next week.  If he is unable to ambulate and perform self-care he will be referred for hospice care.  He can be discharged to home if his daughter can care for him.  If not he will require skilled nursing facility placement.  I am available to discuss the situation with his daughter when she is available.

## 2019-05-29 NOTE — Evaluation (Signed)
Physical Therapy Evaluation Patient Details Name: Arish Rodrick MRN: [id number] DOB: 1941-04-05 Today's Date: 05/29/2019   History of Present Illness  Mohanad Gustave is a 78 y.o. 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 CT showed no acute abnormalities  Clinical Impression  Pt admitted with above diagnosis.  Pt currently with functional limitations due to the deficits listed below (see PT Problem List). Pt will benefit from skilled PT to increase their independence and safety with mobility to allow discharge to the venue listed below.  Pt assisted with standing and able to ambulate short distance.  Pt encouraged to remain up in recliner end of session.  Pt would benefit from 24/7 assist for safety, so if family unable to assist, then pt may need SNF upon d/c.     Follow Up Recommendations SNF;Supervision/Assistance - 24 hour    Equipment Recommendations  None recommended by PT    Recommendations for Other Services       Precautions / Restrictions Precautions Precautions: Fall      Mobility  Bed Mobility Overal bed mobility: Needs Assistance Bed Mobility: Supine to Sit     Supine to sit: Min assist     General bed mobility comments: assist for trunk upright  Transfers Overall transfer level: Needs assistance Equipment used: Rolling walker (2 wheeled) Transfers: Sit to/from Stand Sit to Stand: Min assist         General transfer comment: verbal cues for safe technique, assist to rise and steady  Ambulation/Gait Ambulation/Gait assistance: Min assist;+2 safety/equipment Gait Distance (Feet): 26 Feet Assistive device: Rolling walker (2 wheeled) Gait Pattern/deviations: Step-through pattern;Decreased stride length;Wide base  of support     General Gait Details: verbal cues for RW positionioning, posture, pt fatigued quickly  Stairs            Wheelchair Mobility    Modified Rankin (Stroke Patients Only)       Balance Overall balance assessment: Needs assistance         Standing balance support: Bilateral upper extremity supported Standing balance-Leahy Scale: Poor Standing balance comment: reliant on UEs                             Pertinent Vitals/Pain Pain Assessment: No/denies pain Pain Intervention(s): Monitored during session;Repositioned    Home Living Family/patient expects to be discharged to:: Private residence Living Arrangements: Children Available Help at Discharge: Family Type of Home: House       Home Layout: Two level Home Equipment: Environmental consultant - 2 wheels      Prior Function Level of Independence: Independent with assistive device(s)         Comments: used a RW to assist with mobility, Pt reports that he bathed and dressed himself and did not use any DME in the shower     Hand Dominance        Extremity/Trunk Assessment        Lower Extremity Assessment Lower Extremity Assessment: Generalized weakness       Communication   Communication: No difficulties  Cognition Arousal/Alertness: Awake/alert Behavior During Therapy: WFL for tasks assessed/performed;Flat affect Overall Cognitive Status: No family/caregiver present to determine baseline cognitive functioning  General Comments: appropriate responses, follows commands      General Comments      Exercises     Assessment/Plan    PT Assessment Patient needs continued PT services  PT Problem List Decreased strength;Decreased mobility;Decreased activity tolerance;Decreased balance;Decreased knowledge of use of DME       PT Treatment Interventions Gait training;DME instruction;Therapeutic activities;Patient/family education;Therapeutic  exercise;Functional mobility training;Balance training;Stair training    PT Goals (Current goals can be found in the Care Plan section)  Acute Rehab PT Goals PT Goal Formulation: With patient Time For Goal Achievement: 06/12/19 Potential to Achieve Goals: Good    Frequency Min 3X/week   Barriers to discharge        Co-evaluation               AM-PAC PT "6 Clicks" Mobility  Outcome Measure Help needed turning from your back to your side while in a flat bed without using bedrails?: A Little Help needed moving from lying on your back to sitting on the side of a flat bed without using bedrails?: A Little Help needed moving to and from a bed to a chair (including a wheelchair)?: A Little Help needed standing up from a chair using your arms (e.g., wheelchair or bedside chair)?: A Little Help needed to walk in hospital room?: A Little Help needed climbing 3-5 steps with a railing? : A Lot 6 Click Score: 17    End of Session Equipment Utilized During Treatment: Gait belt Activity Tolerance: Patient tolerated treatment well Patient left: in chair;with chair alarm set;with call bell/phone within reach Nurse Communication: Mobility status PT Visit Diagnosis: Other abnormalities of gait and mobility (R26.89);Muscle weakness (generalized) (M62.81)    Time: [id number] PT Time Calculation (min) (ACUTE ONLY): 15 min   Charges:   PT Evaluation $PT Eval Low Complexity: [place], PT, DPT Acute Rehabilitation Services Office: [phone] Pager: [phone]  Trena Platt 05/29/2019, 12:34 PM
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
## 2019-05-29 NOTE — Progress Notes (Signed)
Patient was bladder scanned 650 cc. In and out cath completed 400 cc. Pt reports having felt pressure in bladder area prior to cath. Pt now reports feeling comfortable.

## 2019-05-29 NOTE — Progress Notes (Addendum)
PROGRESS NOTE    Artist Bloom  ZOX:[id number] DOB: July 01, 1940 DOA: 05/27/2019 PCP: Libby Maw, MD   Brief Narrative:  On 05/27/2019 Patient presented early that morning after midnight via EMS complaining of dizziness, syncope and generalized weakness.  He stated that he had chemotherapy yesterday morning for liver cancer.  He was admitted after midnight by my partner and colleague Dr. Hulan Saas and I am in current agreement with this assessment and plan.  The patient fell early this AM.  Any headache, nausea, vomiting.  Denies any chest pain or abdominal pain but did have some diarrhea.  It is unclear if the patient passed out or not but when his daughter found him on the floor he was awake and his daughter did not see any convulsive activity and she believes he did not lose consciousness.    He was brought to the ED via EMS and initial evaluation showed troponins that were elevated at 166 but now started trending down. Cardiology wasconsulted and felt that chemotherapy may cause cardiotoxicity.  Patient is already anticoagulated with Eliquis for his atrial fibrillation and for a Left Leg DVT.  In the ED he was given 2 500 mL boluses of normal saline and had further work-up and imaging done with a CT head without contrast and a CT angio chest PE.   **Interim History Patient's abdomen is more distended but he felt less short of breath slightly and underwent a repeat paracentesis yesterday and drained 5 L.  Had some acute urine retention Flomax was restarted and currently undergoing in and out catheterizations.  PT and OT recommending SNF.  Palliative care discussion to be held today with the family but patient still wants to pursue aggressive to discuss hospice point.  Will obtain orthostatic vital signs again and anticipate discharging to SNF next few days pending on progress.  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 Syncopal Episode -Admitted to Inpatient and will change Bed Status to Telemetry  -Get PT/OT to Evaluate and Treat and PT/OT recommend SNF; Social Work consulted for Assistance with Placement  -Cycled Cardiac Troponin and was 166 and now Trending down to 151 -Cardiology was consulted for further Evaluation and Recc's and they feel this is Orthostatic; Will check Orthostatic Vital Signs again in the AM -Received NS boluses with 500 mL x1 in the ED  -Will hold further Fluid given Hx of Ascites and Volume Overload;  -Of Not SARS-CoV-2 NEGATIVE  -U/A unremarkable except for some small Hgb and 6-10 WBC -CT Head Showed "No acute intracranial abnormalities" -CTA showed "No central, segmental, or subsegmental pulmonary embolism. Small bilateral pleural effusions, left greater than right with probable adjacent compressive atelectasis. Partially visualized multiple hypodense liver lesions. Moderate abdominal ascites."  -Will check ECHOCardiogram and TSH -ECHO Cardiogram showed normal left ventricular and right ventricular systolic function and size but his right atrial pressure was estimated to be less than 3 mm -Continue to Monitor closely and feel that his worsening ascites is contributing -Continue with incentive spirometry and had a therapeutic paracentesis to be done today -Is just likely severely deconditioned and malnutrition and Dr. 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 Benay Spice as a Courtesy  -Dr. Tonye Royalty discussed with family and they are inclined to ask for Palliative Care -Underwent Chemotherapy with Gemcitabine/Abxraane on 12/4/202 -Palliative Care Consult placed there to meet with the patient and daughter on 05/29/2019 at 1500 -Per Dr. Gearldine Shown discussion patient is not open to Hospice yet and still wants chemotherapy   AKI, improved  Metabolic Acidosis -Patient's BUN/Cr went from 29/1.10 -> 32/1.40 -> 37/1.41 -> 31/0.98 -Given two 500 mL boluses on ADmssion; Hold further fluid due to Ascites and may even start diuresis as he appears volume overloaded -Give 1x of IV Lasix 20 mg if BP allows   -Give him albumin 25 g once and obtained a therapeutic paracentesis yesterday  -Avoid Nephrotoxic Medications, Contrast Dyes and Hypotension if possible -Continue to Monitor   Left Leg DVT -Recently found on Doppler done on 05/12/2019 -C/w with Eliquis  Elevated AST and Alk Phos Hyperbilirubinemia  -Patient's AST was mildly elevated at 52 and Alk Phos was 268; now alk phos is 272 and AST is 50 -In the setting of Metastatic Adenocarcinoma and Ascites -Patient's T Bili went from 1.5 -> 2.1 -Check U/S Abdomen Complete and showed ". Multiple metastatic lesions in the liver.Signs of anasarca with ascites and  generalized edema. No signs of biliary ductal dilation -Repeat Paracentesis yesterday  Abdominal Ascites -In the Setting of Cancer -Checked U/S Abdomen Complete and as above -Had a Paracentesis done today and given Albumin Administration -CTA Chest Showed "Partially visualized multiple hypodense liver lesions. Moderate abdominal ascites."  -As Above   Thrombocytopenia -Patient's platelet count from 168,00 and is now 138,000 -In the setting of liver of his cancer -Continue to monitor for signs and symptoms of bleeding as patient is on apixaban; currently no overt bleeding noted -Repeat CBC in a.m.  GOC: DNR, poA -Palliative Consulted for further GOC Discussions   DVT prophylaxis: Anticoagulated with Apixaban  Code Status: DO NOT RESUSCITATE  Family Communication: Discussed with Daughter at bedside  Disposition Plan: Pending further clinical Improvement   Consultants:   Palliative Care Medicine  Cardiology  Medical Oncology Notified via Courtesy by Epic   Procedures: U/S Paracentesis   Antimicrobials:  Anti-infectives (From admission, onward)   None     Subjective: Seen and examined at bedside and he was sitting in the chair and thinks his shortness of breath is a little bit better but still having some abdominal discomfort.  He still feels very fatigued.  No nausea or vomiting.  No other concerns or complaints at this time.  Objective: Vitals:   05/29/19 0944 05/29/19 1359 05/29/19 1415 05/29/19 1416  BP: 105/63  (!) 90/50 (!) 92/50  Pulse: 91 95 92   Resp:  16    Temp:  99.8 F (37.7 C)    TempSrc:  Oral    SpO2:  100%    Weight:      Height:        Intake/Output Summary (Last 24 hours) at 05/29/2019 1625 Last data filed at 05/29/2019 1000 Gross per 24 hour  Intake 260 ml  Output 1326 ml  Net -1066 ml   Filed Weights   05/27/19 0035  Weight: 82.9 kg   Examination: Physical Exam:  Constitutional: Thin elderly chronically ill-appearing Caucasian  male in no acute distress sitting in the chair at bedside but does appear a little bit uncomfortable Eyes: Lids and conjunctivae normal, sclerae anicteric  ENMT: External Ears, Nose appear normal. Grossly normal hearing.  Poor dentition Neck: Appears normal, supple, no cervical masses, normal ROM, no appreciable thyromegaly; no JVD Respiratory: Diminished to auscultation bilaterally with some coarse breath sounds, no wheezing, rales, rhonchi or crackles. Normal respiratory effort and patient is not tachypenic. No accessory muscle use.  He is unlabored breathing Cardiovascular: RRR, slight murmur appreciated S1 and S2 auscultated.  2-3+ lower extremity edema Abdomen: Soft, mildly tender, Distended with some ascites. Bowel sounds positive.  GU: Deferred. Musculoskeletal: No clubbing / cyanosis of digits/nails. No joint deformity upper and lower extremities Skin: No rashes, lesions, ulcers but does have some bruising on the upper and lower extremities. No induration; Warm and dry.  Neurologic: CN 2-12 grossly intact with no focal deficits. Romberg sign and cerebellar reflexes not assessed.  Psychiatric: Normal judgment and insight. Alert and oriented x 3. Mildly depressed appearing mood and appropriate affect.   Data Reviewed: I have personally reviewed following labs and imaging studies  CBC: Recent Labs  Lab 05/26/19 0840 05/27/19 0155 05/27/19 0205 05/28/19 0537 05/29/19 0500  WBC 5.8 9.4  --  7.7 8.2  NEUTROABS 3.5 7.5  --  7.0 7.5  HGB 13.9 13.0 13.6 12.6* 11.9*  HCT 43.5 40.7 40.0 40.1 37.0*  MCV 91.6 91.7  --  93.5 90.7  PLT 218 194  --  168 330*   Basic Metabolic Panel: Recent Labs  Lab 05/26/19 0840 05/27/19 0205 05/28/19 0537 05/29/19 0500  NA 137 135 137 136  K 3.9 4.2 3.6 3.5  CL 107 106 104 105  CO2 22  --  23 21*  GLUCOSE 105* 111* 112* 101*  BUN 29* 32* 37* 31*  CREATININE 1.10 1.40* 1.41* 0.98  CALCIUM 8.6*  --  8.4* 8.4*  MG  --   --  2.3 2.1  PHOS  --   --   3.2 2.6   GFR: Estimated Creatinine Clearance: 69.1 mL/min (by C-G formula based on SCr of 0.98 mg/dL). Liver Function Tests: Recent Labs  Lab 05/26/19 0840 05/27/19 0155 05/28/19 0537 05/29/19 0500  AST 37 52* 40 50*  ALT 34 40 31 32  ALKPHOS 297* 268* 236* 272*  BILITOT 0.8 1.2 1.5* 2.1*  PROT 5.3* 5.2* 4.8* 4.6*  ALBUMIN 2.5* 2.5* 2.3* 2.3*   No results for input(s): LIPASE, AMYLASE in the last 168 hours. No results for input(s): AMMONIA in the last 168 hours. Coagulation Profile: Recent Labs  Lab 05/28/19 0537  INR 1.7*   Cardiac Enzymes: No results for input(s): CKTOTAL, CKMB, CKMBINDEX, TROPONINI in the last 168 hours. BNP (last 3 results) No results for input(s): PROBNP in the last 8760 hours. HbA1C: No results for input(s): HGBA1C in the last 72 hours. CBG: No  results for input(s): GLUCAP in the last 168 hours. Lipid Profile: No results for input(s): CHOL, HDL, LDLCALC, TRIG, CHOLHDL, LDLDIRECT in the last 72 hours. Thyroid Function Tests: No results for input(s): TSH, T4TOTAL, FREET4, T3FREE, THYROIDAB in the last 72 hours. Anemia Panel: No results for input(s): VITAMINB12, FOLATE, FERRITIN, TIBC, IRON, RETICCTPCT in the last 72 hours. Sepsis Labs: No results for input(s): PROCALCITON, LATICACIDVEN in the last 168 hours.  Recent Results (from the past 240 hour(s))  SARS CORONAVIRUS 2 (TAT 6-24 HRS) Nasopharyngeal Nasopharyngeal Swab     Status: None   Collection Time: 05/27/19  4:53 AM   Specimen: Nasopharyngeal Swab  Result Value Ref Range Status   SARS Coronavirus 2 NEGATIVE NEGATIVE Final    Comment: (NOTE) SARS-CoV-2 target nucleic acids are NOT DETECTED. The SARS-CoV-2 RNA is generally detectable in upper and lower respiratory specimens during the acute phase of infection. Negative results do not preclude SARS-CoV-2 infection, do not rule out co-infections with other pathogens, and should not be used as the sole basis for treatment or other  patient management decisions. Negative results must be combined with clinical observations, patient history, and epidemiological information. The expected result is Negative. Fact Sheet for Patients: SugarRoll.be Fact Sheet for Healthcare Providers: https://www.woods-mathews.com/ This test is not yet approved or cleared by the [place] FDA and  has been authorized for detection and/or diagnosis of SARS-CoV-2 by FDA under an Emergency Use Authorization (EUA). This EUA will remain  in effect (meaning this test can be used) for the duration of the COVID-19 declaration under Section 56 4(b)(1) of the Act, 21 U.S.C. section 360bbb-3(b)(1), unless the authorization is terminated or revoked sooner. Performed at [facility], [place] [address], [place], [place] [address]   Culture, body fluid-bottle     Status: None (Preliminary result)   Collection Time: 05/28/19  1:51 PM   Specimen: Fluid  Result Value Ref Range Status   Specimen Description FLUID PERITONEAL  Final   Special Requests   Final    BOTTLES DRAWN AEROBIC AND ANAEROBIC Blood Culture adequate volume   Culture   Final    NO GROWTH < 24 HOURS Performed at [facility], [place] [address] [place], [place] [address]    Report Status PENDING  Incomplete  Gram stain     Status: None   Collection Time: 05/28/19  1:51 PM   Specimen: Fluid  Result Value Ref Range Status   Specimen Description FLUID PERITONEAL  Final   Special Requests NONE  Final   Gram Stain   Final    NO WBC SEEN NO ORGANISMS SEEN Performed at [facility], [address] [place], [place] [address]    Report Status 05/29/2019 FINAL  Final    Radiology Studies: US Paracentesis  Result Date: 05/28/2019 INDICATION: Patient with history of metastatic adenocarcinoma of unknown primary, left lower extremity DVT, recurrent ascites; request received for diagnostic and therapeutic paracentesis. EXAM:  ULTRASOUND GUIDED DIAGNOSTIC AND THERAPEUTIC PARACENTESIS MEDICATIONS: None COMPLICATIONS: None immediate. PROCEDURE: Informed written consent was obtained from the patient after a discussion of the risks, benefits and alternatives to treatment. A timeout was performed prior to the initiation of the procedure. Initial ultrasound scanning demonstrates a large amount of ascites within the right mid to lower abdominal quadrant. The right mid to lower abdomen was prepped and draped in the usual sterile fashion. 1% lidocaine was used for local anesthesia. Following this, a 19 gauge, 10-cm, Yueh catheter was introduced. An  ultrasound image was saved for documentation purposes. The paracentesis was performed. The catheter was removed and a dressing was applied. The patient tolerated the procedure well without immediate post procedural complication. Patient was administered 25 grams IV albumin by floor nurse on unit FINDINGS: A total of approximately 5 liters of slightly hazy, yellow fluid was removed. Samples were sent to the laboratory as requested by the clinical team. IMPRESSION: Successful ultrasound-guided diagnostic and therapeutic paracentesis yielding 5 liters of peritoneal fluid. Read by: Rowe Robert, PA-C Electronically Signed   By: Lucrezia Europe M.D.   On: 05/28/2019 14:52   Dg Chest Port 1 View  Result Date: 05/29/2019 CLINICAL DATA:  Shortness of breath EXAM: PORTABLE CHEST 1 VIEW COMPARISON:  Chest CT from 2 days ago FINDINGS: Normal heart size and unremarkable mediastinal contours. Left-sided port with tip in good position. Bands of opacity with haziness and costophrenic sulcus blunting greater on the left. No edema or pneumothorax. IMPRESSION: Atelectasis and small pleural effusions. Aeration has improved from 2 days ago. Electronically Signed   By: Monte Fantasia M.D.   On: 05/29/2019 05:16   Scheduled Meds: . apixaban  5 mg Oral BID  . Chlorhexidine Gluconate Cloth  6 each Topical Daily  . feeding  supplement (ENSURE ENLIVE)  237 mL Oral BID BM  . feeding supplement (PRO-STAT SUGAR FREE 64)  30 mL Oral QID  . finasteride  5 mg Oral Daily  . fluticasone  2 spray Each Nare Daily  . folic acid  1 mg Oral Daily  . gabapentin  600 mg Oral BID  . loratadine  10 mg Oral Daily  . multivitamin with minerals  1 tablet Oral Daily  . sodium chloride flush  10-40 mL Intracatheter Q12H  . tamsulosin  0.4 mg Oral Daily  . thiamine  100 mg Oral Daily  . topiramate  50 mg Oral QHS  . vitamin B-12  1,000 mcg Oral Daily   Continuous Infusions:   LOS: 2 days   Kerney Elbe, DO [facility] PAGER is on AMION  If 7PM-7AM, please contact night-coverage [email or web address]

## 2019-05-29 NOTE — Telephone Encounter (Signed)
I talk with patient daughter patient is in hospital

## 2019-05-29 NOTE — TOC Initial Note (Signed)
Transition of Care [facility]) - Initial/Assessment Note    Patient Details  Name: Darius Ellis MRN: [id number] Date of Birth: Aug 06, 1940  [facility]) CM/SW Contact:    Dessa Phi, RN Phone Number: 05/29/2019, 1:04 PM  Clinical Narrative: Patient defers to dtr [name]. PT-recc SNF.Noted for palliative cons-await recc.                  Expected Discharge Plan: Skilled Nursing Facility Barriers to Discharge: Continued Medical Work up   Patient Goals and CMS Choice        Expected Discharge Plan and Services Expected Discharge Plan: [place]   Discharge Planning Services: CM Consult   Living arrangements for the past 2 months: Single Family Home                                      Prior Living Arrangements/Services Living arrangements for the past 2 months: Single Family Home Lives with:: Adult Children Patient language and need for interpreter reviewed:: Yes Do you feel safe going back to the place where you live?: Yes      Need for Family Participation in Patient Care: No (Comment) Care giver support system in place?: Yes (comment)   Criminal Activity/Legal Involvement Pertinent to Current Situation/Hospitalization: No - Comment as needed  Activities of Daily Living Home Assistive Devices/Equipment: Walker (specify type), Cane (specify quad or straight) ADL Screening (condition at time of admission) Patient's cognitive ability adequate to safely complete daily activities?: Yes Is the patient deaf or have difficulty hearing?: No Does the patient have difficulty seeing, even when wearing glasses/contacts?: No Does the patient have difficulty concentrating, remembering, or making decisions?: No Patient able to express need for assistance with ADLs?: Yes Does the patient have difficulty dressing or bathing?: Yes Independently performs ADLs?: No Communication: Independent Dressing (OT): Needs assistance Is this a change from  baseline?: Change from baseline, expected to last <3days Grooming: Independent Feeding: Independent Bathing: Needs assistance Is this a change from baseline?: Change from baseline, expected to last <3 days Toileting: Needs assistance Is this a change from baseline?: Change from baseline, expected to last <3 days In/Out Bed: Needs assistance Is this a change from baseline?: Change from baseline, expected to last <3 days Walks in Home: Independent with device (comment) Does the patient have difficulty walking or climbing stairs?: Yes Weakness of Legs: Both Weakness of Arms/Hands: Both  Permission Sought/Granted Permission sought to share information with : Case Manager Permission granted to share information with : Yes, Verbal Permission Granted  Share Information with NAME: Vaughan Basta dtr [phone]           Emotional Assessment Appearance:: Appears stated age Attitude/Demeanor/Rapport: Gracious Affect (typically observed): Accepting Orientation: : Oriented to Place, Oriented to  Time Alcohol / Substance Use: Not Applicable    Admission diagnosis:  Syncope and collapse [R55] Dehydration [E86.0] Elevated troponin [R77.8] Malignant neoplasm of liver, unspecified liver malignancy type ([place]) [C22.9] Abdominal ascites [R18.8] Generalized weakness [R53.1] Patient Active Problem List   Diagnosis Date Noted  . Syncope and collapse 05/27/2019  . Generalized weakness 05/27/2019  . Do not resuscitate 05/27/2019  . Dehydration   . Elevated troponin   . Malignant neoplasm of liver ([place])   . Port-A-Cath in place 05/26/2019  . 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 Goals of care, counseling/discussion 05/05/2019   PCP:  Libby Maw, MD Pharmacy:   [id number] [place], [place] [address] Phone: [phone] Fax: [phone]     Social Determinants of Health (SDOH)  Interventions    Readmission Risk Interventions No flowsheet data found.

## 2019-05-30 ENCOUNTER — Inpatient Hospital Stay (HOSPITAL_COMMUNITY): Payer: Managed Care, Other (non HMO)

## 2019-05-30 LAB — TSH: TSH: 8.914 u[IU]/mL — ABNORMAL HIGH (ref 0.350–4.500)

## 2019-05-30 LAB — CBC WITH DIFFERENTIAL/PLATELET
Abs Immature Granulocytes: 0.09 10*3/uL — ABNORMAL HIGH (ref 0.00–0.07)
Basophils Absolute: 0 10*3/uL (ref 0.0–0.1)
Basophils Relative: 0 %
Eosinophils Absolute: 0 10*3/uL (ref 0.0–0.5)
Eosinophils Relative: 0 %
HCT: 37.7 % — ABNORMAL LOW (ref 39.0–52.0)
Hemoglobin: 12 g/dL — ABNORMAL LOW (ref 13.0–17.0)
Immature Granulocytes: 1 %
Lymphocytes Relative: 5 %
Lymphs Abs: 0.3 10*3/uL — ABNORMAL LOW (ref 0.7–4.0)
MCH: 29.3 pg (ref 26.0–34.0)
MCHC: 31.8 g/dL (ref 30.0–36.0)
MCV: 92 fL (ref 80.0–100.0)
Monocytes Absolute: 0 10*3/uL — ABNORMAL LOW (ref 0.1–1.0)
Monocytes Relative: 1 %
Neutro Abs: 6 10*3/uL (ref 1.7–7.7)
Neutrophils Relative %: 93 %
Platelets: 100 10*3/uL — ABNORMAL LOW (ref 150–400)
RBC: 4.1 MIL/uL — ABNORMAL LOW (ref 4.22–5.81)
RDW: 17.3 % — ABNORMAL HIGH (ref 11.5–15.5)
WBC: 6.5 10*3/uL (ref 4.0–10.5)
nRBC: 0 % (ref 0.0–0.2)

## 2019-05-30 LAB — COMPREHENSIVE METABOLIC PANEL
ALT: 36 U/L (ref 0–44)
AST: 59 U/L — ABNORMAL HIGH (ref 15–41)
Albumin: 2.2 g/dL — ABNORMAL LOW (ref 3.5–5.0)
Alkaline Phosphatase: 244 U/L — ABNORMAL HIGH (ref 38–126)
Anion gap: 9 (ref 5–15)
BUN: 32 mg/dL — ABNORMAL HIGH (ref 8–23)
CO2: 20 mmol/L — ABNORMAL LOW (ref 22–32)
Calcium: 8 mg/dL — ABNORMAL LOW (ref 8.9–10.3)
Chloride: 102 mmol/L (ref 98–111)
Creatinine, Ser: 1.07 mg/dL (ref 0.61–1.24)
GFR calc Af Amer: >60 mL/min (ref >60)
GFR calc non Af Amer: >60 mL/min (ref >60)
Glucose, Bld: 124 mg/dL — ABNORMAL HIGH (ref 70–99)
Potassium: 3.7 mmol/L (ref 3.5–5.1)
Sodium: 131 mmol/L — ABNORMAL LOW (ref 135–145)
Total Bilirubin: 2 mg/dL — ABNORMAL HIGH (ref 0.3–1.2)
Total Protein: 4.5 g/dL — ABNORMAL LOW (ref 6.5–8.1)

## 2019-05-30 LAB — URINALYSIS, ROUTINE W REFLEX MICROSCOPIC
Bilirubin Urine: NEGATIVE
Glucose, UA: NEGATIVE mg/dL
Ketones, ur: NEGATIVE mg/dL
Nitrite: POSITIVE — AB
Protein, ur: NEGATIVE mg/dL
Specific Gravity, Urine: 1.017 (ref 1.005–1.030)
pH: 5 (ref 5.0–8.0)

## 2019-05-30 LAB — PHOSPHORUS: Phosphorus: 2.7 mg/dL (ref 2.5–4.6)

## 2019-05-30 LAB — PH, BODY FLUID: pH, Body Fluid: 7.5

## 2019-05-30 LAB — SEDIMENTATION RATE: Sed Rate: 24 mm/hr — ABNORMAL HIGH (ref 0–16)

## 2019-05-30 MED ORDER — IPRATROPIUM-ALBUTEROL 0.5-2.5 (3) MG/3ML IN SOLN
3.0000 mL | Freq: Three times a day (TID) | RESPIRATORY_TRACT | Status: DC
  Administered 2019-05-31 – 2019-06-01 (×3): 3 mL via RESPIRATORY_TRACT
  Filled 2019-05-30 (×4): qty 3

## 2019-05-30 MED ORDER — FUROSEMIDE 20 MG PO TABS
20.0000 mg | ORAL_TABLET | Freq: Two times a day (BID) | ORAL | Status: DC
  Administered 2019-05-30 – 2019-06-02 (×7): 20 mg via ORAL
  Filled 2019-05-30 (×7): qty 1

## 2019-05-30 MED ORDER — IPRATROPIUM-ALBUTEROL 0.5-2.5 (3) MG/3ML IN SOLN
3.0000 mL | Freq: Four times a day (QID) | RESPIRATORY_TRACT | Status: DC
  Administered 2019-05-30 (×2): 3 mL via RESPIRATORY_TRACT
  Filled 2019-05-30 (×2): qty 3

## 2019-05-30 MED ORDER — SODIUM CHLORIDE 0.9 % IV SOLN
2.0000 g | INTRAVENOUS | Status: DC
  Administered 2019-05-31 – 2019-06-02 (×3): 2 g via INTRAVENOUS
  Filled 2019-05-30 (×3): qty 2
  Filled 2019-05-30: qty 20

## 2019-05-30 MED ORDER — SODIUM CHLORIDE 0.9 % IV SOLN
1.0000 g | INTRAVENOUS | Status: DC
  Administered 2019-05-30: 1 g via INTRAVENOUS
  Filled 2019-05-30: qty 1

## 2019-05-30 MED ORDER — ACETAMINOPHEN 500 MG PO TABS
500.0000 mg | ORAL_TABLET | Freq: Once | ORAL | Status: AC
  Administered 2019-05-30: 500 mg via ORAL
  Filled 2019-05-30: qty 1

## 2019-05-30 MED ORDER — ALBUTEROL SULFATE (2.5 MG/3ML) 0.083% IN NEBU: 2.5000 mg | INHALATION_SOLUTION | RESPIRATORY_TRACT | Status: DC | PRN

## 2019-05-30 NOTE — Progress Notes (Signed)
   05/30/19 0021  Vitals  Temp (!) 101.4 F (38.6 C)  Temp Source Oral  BP 109/64  MAP (mmHg) 77  BP Location Right Arm  BP Method Automatic  Patient Position (if appropriate) Lying  Pulse Rate 95  Pulse Rate Source Dinamap  Resp (!) 22  Oxygen Therapy  SpO2 96 %  O2 Device Room Air  MEWS Score  MEWS RR 1  MEWS Pulse 0  MEWS Systolic 0  MEWS LOC 0  MEWS Temp 1  MEWS Score 2  MEWS Score Color Yellow  MEWS Assessment  Is this an acute change? Yes  MEWS guidelines implemented *See Row Information* Yellow  Provider Notification  Provider Name/Title [place]  Date Provider Notified 05/30/19  Time Provider Notified [phone]  Notification Type Call  Notification Reason Other (Comment) (fever)  Response Other (Comment) (see new verbal orders)  Date of Provider Response 05/30/19  Time of Provider Response 0042   Pt spiked temp of 101.4. On call provider made aware. New orders for one time dose of 500 mg of tylenol, UA, blood cultures, CXR, and 1 g of rocephin. Will carry out all new orders and initiate MEWS protocol. Will continue to monitor closely.

## 2019-05-30 NOTE — Progress Notes (Addendum)
HEMATOLOGY-ONCOLOGY PROGRESS NOTE  SUBJECTIVE: Fever of 101.4 noted overnight. U/A, BC, and CXR obtained. Started on Rocephin.  Chest x-ray showed increasing hazy bibasilar opacities, likely combination of pleural effusions and compressive atelectasis, difficult to exclude superimposed pneumonia, increasing perivascular haziness/vascular congestion.  Urinalysis showed a small amount of hemoglobin, positive nitrite, and trace leukocytes.  Urine culture pending.  Blood cultures negative to date.  Feels tired this morning.  Not currently febrile.  Denies chest discomfort and shortness of breath.  He denies abdominal pain, nausea, vomiting.  Still with lower extremity edema which is stable.  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 [facility])   05/12/2019 -  Chemotherapy   The patient had PACLitaxel-protein bound (ABRAXANE) chemo infusion 200 mg, 100 mg/m2 = 200 mg (100 % of original dose 100 mg/m2), Intravenous,  Once, 1 of 4 cycles Dose modification: 100 mg/m2 (original dose 100 mg/m2, Cycle 1, Reason: Provider Judgment) Administration: 200 mg (05/12/2019), 200 mg (05/26/2019) gemcitabine (GEMZAR) 1,672 mg in sodium chloride 0.9 % 250 mL chemo infusion, 800 mg/m2 = 1,672 mg (100 % of original dose 800 mg/m2), Intravenous,  Once, 1 of 4 cycles Dose modification: 800 mg/m2 (original dose 800 mg/m2, Cycle 1, Reason: Provider Judgment) Administration: 1,672 mg (05/12/2019), 1,672 mg (05/26/2019)  for chemotherapy treatment.      PHYSICAL EXAMINATION:  Vitals:   05/30/19 0225 05/30/19 0511  BP: 95/61 107/68  Pulse: 89 82  Resp: 16 18  Temp: 98.2 F (36.8 C) 98 F (36.7 C)  SpO2: 96% 95%   Filed Weights   05/27/19 0035  Weight: 182 lb 12.9 oz (82.9 kg)    Intake/Output from previous day: 12/07 0701 - 12/08 0700 In: 119.9 [P.O.:20; IV Piggyback:99.9] Out:  425 [Urine:425]  GENERAL:alert, no distress and comfortable OROPHARYNX: No thrush or mucositis LUNGS: Diminished breath sounds at the lung bases, no respiratory distress HEART: regular rate & rhythm, pitting edema to the bilateral lower extremities, left greater than right ABDOMEN: Positive bowel sounds, mild distention, liver palpable in the right upper abdomen NEURO: alert & oriented x 3 with fluent speech, no focal motor/sensory deficits  LABORATORY DATA:  I have reviewed the data as listed CMP Latest Ref Rng & Units 05/30/2019 05/29/2019 05/28/2019  Glucose 70 - 99 mg/dL 124(H) 101(H) 112(H)  BUN 8 - 23 mg/dL 32(H) 31(H) 37(H)  Creatinine 0.61 - 1.24 mg/dL 1.07 0.98 1.41(H)  Sodium 135 - 145 mmol/L 131(L) 136 137  Potassium 3.5 - 5.1 mmol/L 3.7 3.5 3.6  Chloride 98 - 111 mmol/L 102 105 104  CO2 22 - 32 mmol/L 20(L) 21(L) 23  Calcium 8.9 - 10.3 mg/dL 8.0(L) 8.4(L) 8.4(L)  Total Protein 6.5 - 8.1 g/dL 4.5(L) 4.6(L) 4.8(L)  Total Bilirubin 0.3 - 1.2 mg/dL 2.0(H) 2.1(H) 1.5(H)  Alkaline Phos 38 - 126 U/L 244(H) 272(H) 236(H)  AST 15 - 41 U/L 59(H) 50(H) 40  ALT 0 - 44 U/L 36 32 31    Lab Results  Component Value Date   WBC 6.5 05/30/2019   HGB 12.0 (L) 05/30/2019   HCT 37.7 (L) 05/30/2019   MCV 92.0 05/30/2019   PLT 100 (L) 05/30/2019   NEUTROABS 6.0 05/30/2019    Ct Head Wo Contrast  Result Date: 05/27/2019 CLINICAL DATA:  Lightheadedness.  Syncope. EXAM: CT HEAD WITHOUT CONTRAST TECHNIQUE: Contiguous axial images were obtained from the base of the skull through the  vertex without intravenous contrast. COMPARISON:  April 10, 2019 FINDINGS: Brain: No subdural, epidural, or subarachnoid hemorrhage. Cerebellum, brainstem, and basal sys cisterns are normal. 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 No acute cortical ischemia infarct noted. No mass effect or midline shift. Ventricles and sulci are stable. Vascular: No hyperdense vessel or unexpected calcification. Skull:  Normal. Negative for fracture or focal lesion. Sinuses/Orbits: No acute finding. Other: None. IMPRESSION: No acute intracranial abnormalities. Electronically Signed   By: Dorise Bullion III M.D   On: 05/27/2019 03:17   Ct Chest W Contrast  Addendum Date: 05/12/2019   ADDENDUM REPORT: 05/12/2019 10:36 ADDENDUM: Left-sided Port-A-Cath appears to terminate in the low SVC. It is a small bore catheter and is somewhat obscured by the contrast bolus, however. If further evaluation is desired, PA and lateral views of the chest are recommended. Electronically Signed   By: Lorin Picket M.D.   On: 05/12/2019 10:36   Addendum Date: 05/04/2019   ADDENDUM REPORT: 05/04/2019 12:09 ADDENDUM: Outside CT abdomen/pelvis dated 02/01/2019 submitted for comparison. The liver lesions have significantly increased in number and are stable to increased in size since 02/01/2019 CT, compatible with progression of metastatic disease to the liver. Representative central right liver 7.1 x 3.8 cm mass (series 2/image 65), increased from 4.0 x 2.6 cm. Representative segment 4A left liver lobe 6.9 x 6.3 cm mass (series 2/image 56), previously 6.9 x 6.4 cm, stable. Representative segment 2 left liver lobe 3.5 x 2.8 cm mass (series 2/image 52), previously 3.6 x 2.4 cm, stable. Representative new right liver dome 2.0 x 2.0 cm mass (series 2/image 46). Representative inferior right liver lobe 3.8 x 3.1 cm mass (series 2/image 64), increased from 2.1 x 2.0 cm. The enlarged gastrohepatic ligament, portacaval, left para-aortic and aortocaval lymph nodes appear stable. The anasarca, ascites and pleural effusions are essentially new. Electronically Signed   By: Ilona Sorrel M.D.   On: 05/04/2019 12:09   Result Date: 05/12/2019 CLINICAL DATA:  Metastatic adenocarcinoma of unknown primary, reportedly most likely pancreatic or biliary or upper GI origin per 11/30/2018 liver mass biopsy. Status post chemotherapy, left liver lobe radioembolization  03/03/2019 and right liver lobe radioembolization 03/31/2019. Restaging. [place]. EXAM: CT CHEST, ABDOMEN, AND PELVIS WITH CONTRAST TECHNIQUE: Multidetector CT imaging of the chest, abdomen and pelvis was performed following the standard protocol during bolus administration of intravenous contrast. CONTRAST:  121m OMNIPAQUE IOHEXOL 300 MG/ML  SOLN COMPARISON:  None available at the time of this interpretation. FINDINGS: CT CHEST FINDINGS Cardiovascular: Normal heart size. No significant pericardial effusion/thickening. Three-vessel coronary atherosclerosis. Atherosclerotic nonaneurysmal thoracic aorta. Normal caliber pulmonary arteries. No central pulmonary emboli. Mediastinum/Nodes: Hypodense 1.4 cm left thyroid nodule. Unremarkable esophagus. No pathologically enlarged axillary, mediastinal or hilar lymph nodes. Lungs/Pleura: No pneumothorax. Small dependent right pleural effusion. Moderate dependent left pleural effusion. Moderate passive dependent left lower lobe atelectasis. Mild passive dependent right lower lobe and lingular atelectasis. No acute consolidative airspace disease, lung masses or significant pulmonary nodules. Musculoskeletal: Mild thoracic spondylosis. Small sclerotic foci in the lateral right ninth through twelfth ribs, with the appearance of nearly healed fractures. CT ABDOMEN PELVIS FINDINGS Hepatobiliary: There are numerous (greater than 20) similar peripherally enhancing liver masses scattered throughout the liver. Representative 7.1 x 3.8 cm central right liver mass (series 2/image 65). Representative 6.9 x 6.3 cm segment 4A left liver lobe mass (series 2/image 56). Representative 3.4 x 2.7 cm segment 2 left liver lobe mass (series  2/image 53). Cholecystectomy. No biliary ductal dilatation. Pancreas: Normal, with no mass or duct dilation. Spleen: Normal size. No mass. Adrenals/Urinary Tract: Normal adrenals. Subcentimeter hypodense right renal cortical  lesions are too small to characterize. Otherwise normal kidneys, with no hydronephrosis. Nondistended bladder obscured by streak artifact from left hip hardware. Stomach/Bowel: Normal non-distended stomach. Normal caliber small bowel with no small bowel wall thickening. Appendix not discretely visualized. Oral contrast transits to the colon. Normal large bowel with no diverticulosis, large bowel wall thickening or pericolonic fat stranding. Vascular/Lymphatic: Atherosclerotic nonaneurysmal abdominal aorta. Patent portal, splenic, hepatic and renal veins. Enlarged gastrohepatic ligament nodes up to 1.2 cm (series 2/image 61). Enlarged 1.5 cm portacaval node (series 2/image 69). Enlarged 1.2 cm left para-aortic node (series 2/ image 72). Enlarged 1.2 cm aortocaval node (series 2/image 76). No pelvic adenopathy. Reproductive: Top-normal size prostate, obscured by streak artifact. Other: Moderate to large volume ascites. No focal fluid collection. Ascitic fluid trapped within small right inguinal hernia sac. Mild anasarca. Musculoskeletal: No aggressive appearing focal osseous lesions. Partially visualized left total hip arthroplasty and surgical fixation hardware in the proximal right femur. Marked lumbar spondylosis. IMPRESSION: 1. Numerous peripherally enhancing liver masses throughout the liver, compatible with viable liver metastases. If the outside prior studies are submitted for comparison, an addendum can be issued at that time. 2. Mildly enlarged lymph nodes throughout the gastrohepatic ligament, portacaval, aortocaval and left para-aortic change suspicious for metastatic disease. 3. Findings suggesting third-spacing of fluid including mild anasarca, moderate to large volume ascites and moderate left and small right dependent pleural effusions. 4.  Aortic Atherosclerosis (ICD10-I70.0). Electronically Signed: By: Ilona Sorrel M.D. On: 05/02/2019 18:12   Ct Angio Chest Pe W And/or Wo Contrast  Result Date:  05/27/2019 CLINICAL DATA:  Dizziness and syncope EXAM: CT ANGIOGRAPHY CHEST WITH CONTRAST TECHNIQUE: Multidetector CT imaging of the chest was performed using the standard protocol during bolus administration of intravenous contrast. Multiplanar CT image reconstructions and MIPs were obtained to evaluate the vascular anatomy. CONTRAST:  173m OMNIPAQUE IOHEXOL 350 MG/ML SOLN COMPARISON:  May 02, 2019 FINDINGS: Cardiovascular: There is a optimal opacification of the pulmonary arteries. There is no central,segmental, or subsegmental filling defects within the pulmonary arteries. The heart is normal in size. No pericardial effusion or thickening. No evidence right heart strain. There is normal three-vessel brachiocephalic anatomy without proximal stenosis. Scattered aortic atherosclerotic calcifications are seen without aneurysmal dilatation. Mediastinum/Nodes: No hilar, mediastinal, or axillary adenopathy. Again noted is a 1.4 cm low-density lesion within the left thyroid lobe. Lungs/Pleura: There are small bilateral pleural effusions, left greater right. Patchy/streaky airspace opacity seen at both lung bases which could be compressive atelectasis. Upper Abdomen: There is partially visualized multiple hypodense lesions seen throughout the liver. A moderate amount of abdominal ascites is seen. For Musculoskeletal: No chest wall abnormality. No acute or significant osseous findings. Anterior flowing osteophytes seen throughout the thoracic spine. Review of the MIP images confirms the above findings. IMPRESSION: 1. no central, segmental, or subsegmental pulmonary embolism. 2. Small bilateral pleural effusions, left greater than right with probable adjacent compressive atelectasis. 3. Partially visualized multiple hypodense liver lesions. 4. Moderate abdominal ascites. Electronically Signed   By: BPrudencio PairM.D.   On: 05/27/2019 03:40   UKoreaAbdomen Complete  Result Date: 05/27/2019 CLINICAL DATA:  Acute renal  failure, ascites metastatic disease to the liver. EXAM: ABDOMEN ULTRASOUND COMPLETE COMPARISON:  05/27/2019 and 05/02/2019 FINDINGS: Gallbladder: Gallbladder is contracted with wall thickening in the setting of generalized edema.  No sonographic Murphy's sign reported by the sonographer. Common bile duct: Diameter: 3 mm Liver: Diffuse heterogeneity of the liver with multiple areas of hypo coexisting metastatic disease. These are better displayed on recent CT evaluations. Portal vein is patent on color Doppler imaging with normal direction of blood flow towards the liver. IVC: No abnormality visualized. Pancreas: Visualized portion unremarkable. Spleen: Size and appearance within normal limits. Right Kidney: Length: 9.5 cm. Cortical thinning. No signs of hydronephrosis. Renal sinus lipomatosis. Hypoechoic renal lesions likely small proteinaceous or hemorrhagic cysts unchanged from recent CT of the chest, abdomen and pelvis. Left Kidney: Length: 11.5 cm. Limited assessment of the left kidney. Visualized portions are normal. No visible hydronephrosis. Abdominal aorta: Distal portion of the aorta at the bifurcation is not well visualized. No signs of aneurysm and visible portions. Other findings: Ascites present in all 4 quadrants. Signs of bowel edema, generalized likely related to underlying anasarca. IMPRESSION: 1. Multiple metastatic lesions in the liver. 2. Signs of anasarca with ascites and generalized edema. 3. No signs of biliary ductal dilation. Electronically Signed   By: Zetta Bills M.D.   On: 05/27/2019 11:09   Ct Abdomen Pelvis W Contrast  Addendum Date: 05/12/2019   ADDENDUM REPORT: 05/12/2019 10:36 ADDENDUM: Left-sided Port-A-Cath appears to terminate in the low SVC. It is a small bore catheter and is somewhat obscured by the contrast bolus, however. If further evaluation is desired, PA and lateral views of the chest are recommended. Electronically Signed   By: Lorin Picket M.D.   On: 05/12/2019  10:36   Addendum Date: 05/04/2019   ADDENDUM REPORT: 05/04/2019 12:09 ADDENDUM: Outside CT abdomen/pelvis dated 02/01/2019 submitted for comparison. The liver lesions have significantly increased in number and are stable to increased in size since 02/01/2019 CT, compatible with progression of metastatic disease to the liver. Representative central right liver 7.1 x 3.8 cm mass (series 2/image 65), increased from 4.0 x 2.6 cm. Representative segment 4A left liver lobe 6.9 x 6.3 cm mass (series 2/image 56), previously 6.9 x 6.4 cm, stable. Representative segment 2 left liver lobe 3.5 x 2.8 cm mass (series 2/image 52), previously 3.6 x 2.4 cm, stable. Representative new right liver dome 2.0 x 2.0 cm mass (series 2/image 46). Representative inferior right liver lobe 3.8 x 3.1 cm mass (series 2/image 64), increased from 2.1 x 2.0 cm. The enlarged gastrohepatic ligament, portacaval, left para-aortic and aortocaval lymph nodes appear stable. The anasarca, ascites and pleural effusions are essentially new. Electronically Signed   By: Ilona Sorrel M.D.   On: 05/04/2019 12:09   Result Date: 05/12/2019 CLINICAL DATA:  Metastatic adenocarcinoma of unknown primary, reportedly most likely pancreatic or biliary or upper GI origin per 11/30/2018 liver mass biopsy. Status post chemotherapy, left liver lobe radioembolization 03/03/2019 and right liver lobe radioembolization 03/31/2019. Restaging. [place]. EXAM: CT CHEST, ABDOMEN, AND PELVIS WITH CONTRAST TECHNIQUE: Multidetector CT imaging of the chest, abdomen and pelvis was performed following the standard protocol during bolus administration of intravenous contrast. CONTRAST:  115m OMNIPAQUE IOHEXOL 300 MG/ML  SOLN COMPARISON:  None available at the time of this interpretation. FINDINGS: CT CHEST FINDINGS Cardiovascular: Normal heart size. No significant pericardial effusion/thickening. Three-vessel coronary atherosclerosis.  Atherosclerotic nonaneurysmal thoracic aorta. Normal caliber pulmonary arteries. No central pulmonary emboli. Mediastinum/Nodes: Hypodense 1.4 cm left thyroid nodule. Unremarkable esophagus. No pathologically enlarged axillary, mediastinal or hilar lymph nodes. Lungs/Pleura: No pneumothorax. Small dependent right pleural effusion. Moderate dependent left pleural effusion. Moderate passive  dependent left lower lobe atelectasis. Mild passive dependent right lower lobe and lingular atelectasis. No acute consolidative airspace disease, lung masses or significant pulmonary nodules. Musculoskeletal: Mild thoracic spondylosis. Small sclerotic foci in the lateral right ninth through twelfth ribs, with the appearance of nearly healed fractures. CT ABDOMEN PELVIS FINDINGS Hepatobiliary: There are numerous (greater than 20) similar peripherally enhancing liver masses scattered throughout the liver. Representative 7.1 x 3.8 cm central right liver mass (series 2/image 65). Representative 6.9 x 6.3 cm segment 4A left liver lobe mass (series 2/image 56). Representative 3.4 x 2.7 cm segment 2 left liver lobe mass (series 2/image 53). Cholecystectomy. No biliary ductal dilatation. Pancreas: Normal, with no mass or duct dilation. Spleen: Normal size. No mass. Adrenals/Urinary Tract: Normal adrenals. Subcentimeter hypodense right renal cortical lesions are too small to characterize. Otherwise normal kidneys, with no hydronephrosis. Nondistended bladder obscured by streak artifact from left hip hardware. Stomach/Bowel: Normal non-distended stomach. Normal caliber small bowel with no small bowel wall thickening. Appendix not discretely visualized. Oral contrast transits to the colon. Normal large bowel with no diverticulosis, large bowel wall thickening or pericolonic fat stranding. Vascular/Lymphatic: Atherosclerotic nonaneurysmal abdominal aorta. Patent portal, splenic, hepatic and renal veins. Enlarged gastrohepatic ligament nodes up  to 1.2 cm (series 2/image 61). Enlarged 1.5 cm portacaval node (series 2/image 69). Enlarged 1.2 cm left para-aortic node (series 2/ image 72). Enlarged 1.2 cm aortocaval node (series 2/image 76). No pelvic adenopathy. Reproductive: Top-normal size prostate, obscured by streak artifact. Other: Moderate to large volume ascites. No focal fluid collection. Ascitic fluid trapped within small right inguinal hernia sac. Mild anasarca. Musculoskeletal: No aggressive appearing focal osseous lesions. Partially visualized left total hip arthroplasty and surgical fixation hardware in the proximal right femur. Marked lumbar spondylosis. IMPRESSION: 1. Numerous peripherally enhancing liver masses throughout the liver, compatible with viable liver metastases. If the outside prior studies are submitted for comparison, an addendum can be issued at that time. 2. Mildly enlarged lymph nodes throughout the gastrohepatic ligament, portacaval, aortocaval and left para-aortic change suspicious for metastatic disease. 3. Findings suggesting third-spacing of fluid including mild anasarca, moderate to large volume ascites and moderate left and small right dependent pleural effusions. 4.  Aortic Atherosclerosis (ICD10-I70.0). Electronically Signed: By: Ilona Sorrel M.D. On: 05/02/2019 18:12   US Paracentesis  Result Date: 05/28/2019 INDICATION: Patient with history of metastatic adenocarcinoma of unknown primary, left lower extremity DVT, recurrent ascites; request received for diagnostic and therapeutic paracentesis. EXAM: ULTRASOUND GUIDED DIAGNOSTIC AND THERAPEUTIC PARACENTESIS MEDICATIONS: None COMPLICATIONS: None immediate. PROCEDURE: Informed written consent was obtained from the patient after a discussion of the risks, benefits and alternatives to treatment. A timeout was performed prior to the initiation of the procedure. Initial ultrasound scanning demonstrates a large amount of ascites within the right mid to lower abdominal  quadrant. The right mid to lower abdomen was prepped and draped in the usual sterile fashion. 1% lidocaine was used for local anesthesia. Following this, a 19 gauge, 10-cm, Yueh catheter was introduced. An ultrasound image was saved for documentation purposes. The paracentesis was performed. The catheter was removed and a dressing was applied. The patient tolerated the procedure well without immediate post procedural complication. Patient was administered 25 grams IV albumin by floor nurse on unit FINDINGS: A total of approximately 5 liters of slightly hazy, yellow fluid was removed. Samples were sent to the laboratory as requested by the clinical team. IMPRESSION: Successful ultrasound-guided diagnostic and therapeutic paracentesis yielding 5 liters of peritoneal fluid. Read by: Lennette Bihari  Allred, PA-C Electronically Signed   By: Lucrezia Europe M.D.   On: 05/28/2019 14:52   US Paracentesis  Result Date: 05/22/2019 INDICATION: Patient with history of metastatic adenocarcinoma of unknown primary with ascites. Request was made for therapeutic and diagnostic paracentesis up to 5 L maximum EXAM: ULTRASOUND GUIDED THERAPEUTIC AND DIAGNOSTIC PARACENTESIS MEDICATIONS: None. COMPLICATIONS: None immediate. PROCEDURE: Informed written consent was obtained from the patient after a discussion of the risks, benefits and alternatives to treatment. A timeout was performed prior to the initiation of the procedure. Initial ultrasound scanning demonstrates a large amount of ascites within the right lower abdominal quadrant. The right lower abdomen was prepped and draped in the usual sterile fashion. 1% lidocaine was used for local anesthesia. Following this, a 19 gauge, 7-cm, Yueh catheter was introduced. An ultrasound image was saved for documentation purposes. The paracentesis was performed. The catheter was removed and a dressing was applied. The patient tolerated the procedure well without immediate post procedural complication.  FINDINGS: A total of approximately 5 L of straw colored fluid was removed. Samples were sent to the laboratory as requested by the clinical team. IMPRESSION: Successful ultrasound-guided therapeutic and diagnostic paracentesis yielding 5 liters of peritoneal fluid. Read by Rushie Nyhan NP Electronically Signed   By: Sandi Mariscal M.D.   On: 05/22/2019 11:01   US Paracentesis  Result Date: 05/05/2019 INDICATION: Patient with history of metastatic adenocarcinoma of unknown primary, ascites. Request made for diagnostic and therapeutic paracentesis up to 5 liters. EXAM: ULTRASOUND GUIDED DIAGNOSTIC AND THERAPEUTIC PARACENTESIS MEDICATIONS: None COMPLICATIONS: None immediate. PROCEDURE: Informed written consent was obtained from the patient after a discussion of the risks, benefits and alternatives to treatment. A timeout was performed prior to the initiation of the procedure. Initial ultrasound scanning demonstrates a large amount of ascites within the left mid to lower abdominal quadrant. The left mid to lower abdomen was prepped and draped in the usual sterile fashion. 1% lidocaine was used for local anesthesia. Following this, a 19 gauge, 7-cm, Yueh catheter was introduced. An ultrasound image was saved for documentation purposes. The paracentesis was performed. The catheter was removed and a dressing was applied. The patient tolerated the procedure well without immediate post procedural complication. FINDINGS: A total of approximately 5 liters of yellow fluid was removed. Samples were sent to the laboratory as requested by the clinical team. IMPRESSION: Successful ultrasound-guided diagnostic and therapeutic paracentesis yielding 5 liters of peritoneal fluid. Read by: Rowe Robert, PA-C Electronically Signed   By: Sandi Mariscal M.D.   On: 05/05/2019 16:51   Dg Chest Port 1 View  Result Date: 05/30/2019 CLINICAL DATA:  Fever.  Chemotherapy. EXAM: PORTABLE CHEST 1 VIEW COMPARISON:  Radiograph yesterday. CT  05/27/2019 FINDINGS: Axis left chest port remains in place. Lower lung volumes from prior exam. Increasing hazy bibasilar opacities. Increasing perivascular haziness/vascular congestion. Changed heart size and mediastinal contours. Aortic atherosclerosis and tortuosity. No pneumothorax. IMPRESSION: 1. Increasing hazy bibasilar opacities, likely combination of pleural effusions a compressive atelectasis. Difficult to exclude superimposed pneumonia. 2. Increasing perivascular haziness/vascular congestion. 3. Aortic atherosclerosis. Electronically Signed   By: Keith Rake M.D.   On: 05/30/2019 01:18   Dg Chest Port 1 View  Result Date: 05/29/2019 CLINICAL DATA:  Shortness of breath EXAM: PORTABLE CHEST 1 VIEW COMPARISON:  Chest CT from 2 days ago FINDINGS: Normal heart size and unremarkable mediastinal contours. Left-sided port with tip in good position. Bands of opacity with haziness and costophrenic sulcus blunting greater on the left. No  edema or pneumothorax. IMPRESSION: Atelectasis and small pleural effusions. Aeration has improved from 2 days ago. Electronically Signed   By: Monte Fantasia M.D.   On: 05/29/2019 05:16   Dg Chest Portable 1 View  Result Date: 05/27/2019 CLINICAL DATA:  Dizziness and syncope EXAM: PORTABLE CHEST 1 VIEW COMPARISON:  None. FINDINGS: There is mild cardiomegaly. A left-sided MediPort catheter is seen with the tip at the lower SVC. There is moderate left and small right pleural effusion. Hazy airspace opacity seen adjacent to lower lungs which is likely layering effusion and/or compressive atelectasis. No acute osseous abnormality. IMPRESSION: Moderate left and small right pleural effusion with probable adjacent layering effusion and/or atelectasis. Electronically Signed   By: Prudencio Pair M.D.   On: 05/27/2019 01:00   Vas [place] Lower Extremity Venous (dvt)  Result Date: 05/12/2019  Lower Venous Study Indications: Swelling.  Risk Factors: Cancer. Limitations: Poor  ultrasound/tissue interface and Ascites. Comparison Study: No prior studies. Performing Technologist: Oliver Hum RVT  Examination Guidelines: A complete evaluation includes B-mode imaging, spectral Doppler, color Doppler, and power Doppler as needed of all accessible portions of each vessel. Bilateral testing is considered an integral part of a complete examination. Limited examinations for reoccurring indications may be performed as noted.  +-----+---------------+---------+-----------+----------+--------------+ RIGHTCompressibilityPhasicitySpontaneityPropertiesThrombus Aging +-----+---------------+---------+-----------+----------+--------------+ CFV  Full           Yes      Yes                                 +-----+---------------+---------+-----------+----------+--------------+   +---------+---------------+---------+-----------+----------+--------------+ LEFT     CompressibilityPhasicitySpontaneityPropertiesThrombus Aging +---------+---------------+---------+-----------+----------+--------------+ CFV      Partial        No       No                   Acute          +---------+---------------+---------+-----------+----------+--------------+ FV Prox  None                                         Acute          +---------+---------------+---------+-----------+----------+--------------+ FV Mid   None                                         Acute          +---------+---------------+---------+-----------+----------+--------------+ FV DistalNone                                         Acute          +---------+---------------+---------+-----------+----------+--------------+ PFV      Full                                                        +---------+---------------+---------+-----------+----------+--------------+ POP      None           No       No  Acute           +---------+---------------+---------+-----------+----------+--------------+ PTV      None                                         Acute          +---------+---------------+---------+-----------+----------+--------------+ PERO     None                                         Acute          +---------+---------------+---------+-----------+----------+--------------+ EIV      None           No       No                   Acute          +---------+---------------+---------+-----------+----------+--------------+ Unable to visualize the common iliac vein, and inferior vena cava due to ascites.    Summary: Right: There is no evidence of deep vein thrombosis in the lower extremity. Left: Findings consistent with acute deep vein thrombosis involving the left external iliac vein, common femoral vein, left femoral vein, left popliteal vein, left posterior tibial veins, and left peroneal veins. No cystic structure found in the popliteal fossa.  *See table(s) above for measurements and observations. Electronically signed by Servando Snare MD on 05/12/2019 at 5:43:03 PM.    Final     ASSESSMENT AND PLAN: 1. Metastatic adenocarcinoma unknown primary  CTs6/01/2019-large mass left hepatic lobe measuring 9.1 cm, few ill-defined lesions in the right hepatic lobe measuring up to 9 mm. Indeterminate gastrohepatic ligament lymph node measuring 1.1 cm. Shotty periportal and right pericardial adenopathy.  Biopsy liver lesion6/03/2019 -adenocarcinoma unknown primary most likely pancreatobiliary tree(including intrahepatic cholangiocarcinoma)and upper GI tract;CK7 positive; CK20, TTF-1, CDX2, GATA 3, p63 and PSA negative  Colonoscopy 12/09/2018-internal hemorrhoids, mild. Examination otherwise normal.  Upper endoscopy 12/09/2018-examined duodenum normal. 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  CT chest 12/13/2018-heterogeneous hypodense nodule left thyroid lobe. Previously reported small bilateral pleural effusions have resolved. Trace pericardial effusion. Interval development of several small lymph nodes in the right anterior cardiophrenic space measuring up to 1.2 x 0.8 cm. Also mildly prominent lymph nodes in the periceliac region, gastrohepatic ligament and periportal area.  12/13/2018 CEA 8.2   12/28/2018 PET scan-multiple intensely FDG avid hepatic metastases. FDG avid anterior cardiophrenic, periportal, periceliac and gastrohepatic ligament lymph nodes. New trace right pleural effusion. Stable trace pericardial effusion.  Cycle 1 FOLFOX 01/10/2019   Cycle 2 FOLFOX 01/24/2019   01/24/2019 CEA 13.3, CA 19-9 163  CTs 02/01/2019-multiple confluent liver lesions noted occupying nearly the entire left lobe. More separated lesions in the right lobe have increased in size. At least 10-12 enlarging/new lesions in the right lobe of the liver.  Cycle 3 FOLFOX 02/07/2019  03/03/2019-left hepatic Y90  03/31/2019-right hepatic Y90  CTs 05/02/2019-liver lesions have significantly increased in number; othersare stable to increased in size since 02/01/2019 CT. Enlarged gastrohepatic ligament, portacaval, left periaortic and aortocaval lymph nodes appear stable. Anasarca, ascites and pleural effusions are essentially new.  Guardant 360 testing 05/12/2019- PIK3CA, IDH2 alterations, MSI high not detected, tumor mutation burden 8.57, ATM Vus  Cycle 1 gemcitabine/Abraxane 05/12/2019  Cycle 2 gemcitabine/Abraxane 05/26/2019 2. BPH 3. Hearing loss 4. Neuropathy 5. History of H. pylori status post treatment 6. History of atrial fibrillation 7. Asymmetric leg edema-referred for Doppler 8. Ascites-paracentesis 05/05/2019 with 5 L removed, cytology with reactive mesothelial cells, paracentesis 05/28/2019 with 5 L removed, cytology  pending. 9. Left lower extremity DVT confirmed on Doppler 05/12/2019-apixaban started 10. Hospital admission 05/27/2019 - syncope  Darius Ellis appears stable this morning.  He developed a fever overnight and had a chest x-ray, urinalysis and urine culture, and blood cultures.  He was started on IV antibiotics.  He is not currently febrile.  He still remains weak.  PT consult has been completed and they have recommended SNF; supervision/assistance-24-hour.  The patient's daughter is planning to take FMLA to be home with him. The patient is status post paracentesis with improvement of his abdominal distention.  Cytology is pending.  The patient has expressed that he is not interested in pursuing hospice and would like additional chemotherapy as an outpatient.  He has developed some mild thrombocytopenia from his recent chemotherapy.  Recommendations: 1.  We will follow-up on cytology from paracentesis. 2.  Continue to work with PT/OT. 3.  Continue to follow blood cultures and urine culture.  Continue IV antibiotics per hospitalist.   LOS: 3 days   Mikey Bussing, DNP, AGPCNP-BC, AOCNP 05/30/19  Mr. Boehlin was interviewed and examined.  His overall status appears unchanged.  He indicated he would like to go home with his daughter.  He would like to continue the trial of chemotherapy if his performance status will allow.  We will reassess his condition when he returns for a scheduled appointment next week.  The fever last night may be related to "tumor "fever or urinary tract infection.  No other apparent source for infection.  I discussed the case with his daughter by telephone last night and again today.  She understands the poor prognosis.  We can initiate hospice care from home if his clinical status continues to decline.

## 2019-05-30 NOTE — Progress Notes (Signed)
PROGRESS NOTE    Darius Ellis  QAS:[id number] DOB: 08/18/1940 DOA: 05/27/2019 PCP: Libby Maw, MD   Brief Narrative:  On 05/27/2019 Patient presented early that morning after midnight via EMS complaining of dizziness, syncope and generalized weakness.  He stated that he had chemotherapy yesterday morning for liver cancer.  He was admitted after midnight by my partner and colleague Dr. Hulan Saas and I am in current agreement with this assessment and plan.  The patient fell early this AM.  Any headache, nausea, vomiting.  Denies any chest pain or abdominal pain but did have some diarrhea.  It is unclear if the patient passed out or not but when his daughter found him on the floor he was awake and his daughter did not see any convulsive activity and she believes he did not lose consciousness.    He was brought to the ED via EMS and initial evaluation showed troponins that were elevated at 166 but now started trending down. Cardiology wasconsulted and felt that chemotherapy may cause cardiotoxicity.  Patient is already anticoagulated with Eliquis for his atrial fibrillation and for a Left Leg DVT.  In the ED he was given 2 500 mL boluses of normal saline and had further work-up and imaging done with a CT head without contrast and a CT angio chest PE.   **Interim History Patient's abdomen is more distended but he felt less short of breath slightly and underwent a repeat paracentesis yesterday and drained 5 L.  Had some acute urine retention Flomax was restarted and currently undergoing in and out catheterizations.  PT and OT recommending SNF but family and patient refusing and wanting to go home.  Palliative care discussion to be held yesterday with the family but patient still wants to pursue aggressive to discuss hospice point.  Overnight the patient spiked a temperature of 101.4.  Is pancultured and placed on empiric antibiotics with IV ceftriaxone but unfortunately he was given ceftriaxone  before urine culture could be obtained.  Patient is now complaining of lower abdominal pain near his hernia.  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 Syncopal Episode -Admitted to Inpatient and will change Bed Status to Telemetry  -Get PT/OT to Evaluate and Treat and PT/OT recommend SNF; Social Work consulted for Assistance with Placement but patient and family want to go home instead with home health -Cycled Cardiac Troponin and was 166 and now Trending down to 151 -Cardiology was consulted for further Evaluation and Recc's and they feel this is Orthostatic; Will check Orthostatic Vital Signs again in the AM -Received NS boluses with 500 mL x1 in the ED but now is being diuresed gently -Will hold further Fluid given Hx of Ascites and Volume Overload;  -Of Not SARS-CoV-2 NEGATIVE  -U/A unremarkable except for some small Hgb and 6-10 WBC -CT Head Showed "No acute intracranial abnormalities" -CTA showed "No central, segmental, or subsegmental pulmonary embolism. Small bilateral pleural effusions, left greater than right with probable adjacent compressive atelectasis. Partially visualized multiple hypodense liver lesions. Moderate abdominal ascites."  -Will check ECHOCardiogram and TSH -ECHO Cardiogram showed normal left ventricular and right ventricular systolic function and size but his right atrial pressure  was estimated to be less than 3 mm -Continue to Monitor closely and feel that his worsening ascites is contributing -Continue with incentive spirometry and had a therapeutic paracentesis to be done today -Is just likely severely deconditioned and malnutrition and Dr. Benay Spice does not feel the  symptoms were directly related to the chemotherapy regimen -TSH was 8.914  Dyspnea, improving  -Progressively getting worse and initially improved after Paracentesis on 05/22/2019 with removal of 5 Liters -Checked ECHO and as above  -Was to Give IV Lasix 20 mg if BP allows but was initially low so held off but will be given today again as BP had improved; Given 25 g of albumin the day before yesterday and order a therapeutic paracentesis -Cardiology was following and appreciate further evaluation and Recc's but now they have signed off -Chest x-ray this a.m. showed "Increasing hazy bibasilar opacities, likely combination of pleural effusions a compressive atelectasis. Difficult to exclude superimposed pneumonia.. Increasing perivascular haziness/vascular congestion.  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 -Obtain another therapeutic paracentesis and he had 5 L off a few days ago -Palliative care consulted for further evaluation recommendations as well as his medical oncologist -Patient's daughter wants to take the patient home at discharge with palliative care medicine as well as home health OT and PT  BPH Acute Urinary Retention -Had Urinary Retention today and yesterday  -C/w Finasteride 5 mg po Daily and resumed Tamsulosin 0.4 mg Daily -Strict I's/O's and Daily Weights -I and O Catheter Protocol and if >24 Hours will place Foley  -We will check a bladder scan again  Metastatic Adenocarcinoma with Unknown Primary -Will Consult Dr. Benay Spice as a Courtesy  -Dr. Tonye Royalty discussed with family and they are inclined to ask for Palliative Care -Underwent Chemotherapy with Gemcitabine/Abxraane on 12/4/202 -Palliative Care Consult placed there to meet with the patient and daughter on 05/29/2019 at 1500 -Per Dr. Gearldine Shown discussion patient is not open to Hospice yet and still wants chemotherapy   AKI, improved   Metabolic Acidosis -Patient's BUN/Cr went from 29/1.10 -> 32/1.40 -> 37/1.41 -> 31/0.98 -> 32/1.07 -Given two 500 mL boluses on ADmssion; Hold further fluid due to Ascites and may even start diuresis as he appears volume overloaded -Patient's CO2 is now twenty, chloride is one hundred two, and anion gap is nine -Give another 1x of IV Lasix 20 mg if BP allows;; given one dose of IV Lasix yesterday -Give him albumin 25 g once and obtained a therapeutic paracentesis yesterday  -Avoid Nephrotoxic Medications, Contrast Dyes and Hypotension if possible -Continue to Monitor   Left Leg DVT -Recently found on Doppler done on 05/12/2019 -C/w with Eliquis  Elevated AST and Alk Phos Hyperbilirubinemia  -Patient's AST was mildly elevated at 52 and Alk Phos was 268; now alk phos is 244 and AST is 59 -In the setting of Metastatic Adenocarcinoma and Ascites -Patient's T Bili went from 1.5 -> 2.1 -> 2.0 -Check U/S Abdomen Complete and showed ". Multiple metastatic lesions in the liver.Signs of anasarca with ascites and generalized edema. No signs of biliary ductal dilation -Repeat Paracentesis yesterday  Abdominal Ascites -In the Setting of Cancer -Checked U/S Abdomen Complete and as above -Had a Paracentesis done today and given Albumin Administration -CTA Chest Showed "Partially visualized multiple hypodense liver lesions. Moderate abdominal ascites."  -As Above   Thrombocytopenia -Patient's platelet count from 168,00 and is now 100,000 -In the setting of liver of his cancer -Continue  to monitor for signs and symptoms of bleeding as patient is on apixaban; currently no overt bleeding noted -Repeat CBC in a.m.  Fever -Unclear etiology at this point and will panculture and obtain blood cultures x2, chest x-ray, urinalysis; unfortunately urine culture was not obtained until antibiotics were already given  -Urinalysis showed amber color urine, with small hemoglobin, trace leukocytes, positive  nitrites, pH of 5.0, negative protein, specific gravity 1.017, many bacteria, 0-5 red blood cells per high-power field, and 11-20 WBCs -Likely source of infection is his urine given that his paracentesis showed no evidence of any SBP -Chest x-ray did show possible pneumonia and worsening Increasing hazy bibasilar opacities, likely combination of pleural effusions a compressive atelectasis. Difficult to exclude superimposed pneumonia.  There was also increasing perivascular haziness/vascular congestion -Cultures x2 is still pending as well as urine culture -Continue to monitor and wanted patient to be at least afebrile for least 24 hours prior to discharge -Patient started on IV ceftriaxone and will monitor clinical response to intervention -Patient's ESR was 24  Hyponatremia -In the setting of IV diuresis as well liver cirrhosis Patient's sodium is 131 -Continue to monitor and trend and repeat CMP in a.m.  GOC: DNR, poA -Palliative Consulted for further GOC Discussions  -Wants to continue therapy and daughter and patient will go home with home health PT OT and with palliative care  DVT prophylaxis: Anticoagulated with Apixaban  Code Status: DO NOT RESUSCITATE  Family Communication: Discussed with Daughter at bedside  Disposition Plan: Pending further clinical Improvement   Consultants:   Palliative Care Medicine  Cardiology  Medical Oncology Notified via [place] by Epic   Procedures: U/S Paracentesis   Antimicrobials:  Anti-infectives (From admission, onward)   Start     Dose/Rate Route Frequency Ordered Stop   05/31/19 0100  cefTRIAXone (ROCEPHIN) 2 g in sodium chloride 0.9 % 100 mL IVPB     2 g 200 mL/hr over 30 Minutes Intravenous Every 24 hours 05/30/19 1238     05/30/19 0100  cefTRIAXone (ROCEPHIN) 1 g in sodium chloride 0.9 % 100 mL IVPB  Status:  Discontinued     1 g 200 mL/hr over 30 Minutes Intravenous Every 24 hours 05/30/19 0040 05/30/19 1238     Subjective:  Seen and examined at bedside and he spiked a temperature last night.  Was complaining of some lower abdominal pain near his hernia.  No chest pain, lightheadedness and thinks his dyspnea is slightly better but has no cough.  No nausea or vomiting.  No other concerns or complaints at this time and had trouble urinating yesterday and blamed it on the condom catheter not being properly placed.  No other concerns or complaints at this time.  Objective: Vitals:   05/30/19 0021 05/30/19 0225 05/30/19 0511 05/30/19 1340  BP: 109/64 95/61 107/68 100/70  Pulse: 95 89 82 80  Resp: (!) '22 16 18 16  '$ Temp: (!) 101.4 F (38.6 C) 98.2 F (36.8 C) 98 F (36.7 C) 98.7 F (37.1 C)  TempSrc: Oral Oral Oral Axillary  SpO2: 96% 96% 95% 96%  Weight:      Height:        Intake/Output Summary (Last 24 hours) at 05/30/2019 1535 Last data filed at 05/30/2019 0600 Gross per 24 hour  Intake 119.87 ml  Output 0 ml  Net 119.87 ml   Filed Weights   05/27/19 0035  Weight: 82.9 kg   Examination: Physical Exam:  Constitutional: Thin elderly chronically ill-appearing Caucasian male currently  sitting up in bed in slight distress and does appear a little uncomfortable and fatigued Eyes: Lids and conjunctivae normal, sclerae anicteric  ENMT: External Ears, Nose appear normal.  Hard of hearing; has poor dentition.  Neck: Appears normal, supple, no cervical masses, normal ROM, no appreciable thyromegaly; no JVD Respiratory: Diminished to auscultation bilaterally, no wheezing, rales, rhonchi or crackles.  Mildly increased respiratory rate but has unlabored breathing Cardiovascular: RRR, no murmurs / rubs / gallops. S1 and S2 auscultated.  Has 1-2+ lower extremity edema with the left being worse than the right  Abdomen: Soft, tender to palpate in the lower abdomen, distended with ascites. Bowel sounds positive.  GU: Deferred. Musculoskeletal: No clubbing / cyanosis of digits/nails. No joint deformity upper and lower  extremities.  Skin: No rashes, lesions, ulcers on a limited skin evaluation does have some bruising and ecchymosis in the upper lower extremities. No induration; Warm and dry.  Neurologic: CN 2-12 grossly intact with no focal deficits. Romberg sign and cerebellar reflexes not assessed.  Psychiatric: Normal judgment and insight.  Fatigued appearing but awake.  Depressed appearing mood and appropriate affect.   Data Reviewed: I have personally reviewed following labs and imaging studies  CBC: Recent Labs  Lab 05/26/19 0840 05/27/19 0155 05/27/19 0205 05/28/19 0537 05/29/19 0500 05/30/19 0105  WBC 5.8 9.4  --  7.7 8.2 6.5  NEUTROABS 3.5 7.5  --  7.0 7.5 6.0  HGB 13.9 13.0 13.6 12.6* 11.9* 12.0*  HCT 43.5 40.7 40.0 40.1 37.0* 37.7*  MCV 91.6 91.7  --  93.5 90.7 92.0  PLT 218 194  --  168 138* 494*   Basic Metabolic Panel: Recent Labs  Lab 05/26/19 0840 05/27/19 0205 05/28/19 0537 05/29/19 0500 05/30/19 0105  NA 137 135 137 136 131*  K 3.9 4.2 3.6 3.5 3.7  CL 107 106 104 105 102  CO2 22  --  23 21* 20*  GLUCOSE 105* 111* 112* 101* 124*  BUN 29* 32* 37* 31* 32*  CREATININE 1.10 1.40* 1.41* 0.98 1.07  CALCIUM 8.6*  --  8.4* 8.4* 8.0*  MG  --   --  2.3 2.1  --   PHOS  --   --  3.2 2.6 2.7   GFR: Estimated Creatinine Clearance: 63.3 mL/min (by C-G formula based on SCr of 1.07 mg/dL). Liver Function Tests: Recent Labs  Lab 05/26/19 0840 05/27/19 0155 05/28/19 0537 05/29/19 0500 05/30/19 0105  AST 37 52* 40 50* 59*  ALT 34 40 31 32 36  ALKPHOS 297* 268* 236* 272* 244*  BILITOT 0.8 1.2 1.5* 2.1* 2.0*  PROT 5.3* 5.2* 4.8* 4.6* 4.5*  ALBUMIN 2.5* 2.5* 2.3* 2.3* 2.2*   No results for input(s): LIPASE, AMYLASE in the last 168 hours. No results for input(s): AMMONIA in the last 168 hours. Coagulation Profile: Recent Labs  Lab 05/28/19 0537  INR 1.7*   Cardiac Enzymes: No results for input(s): CKTOTAL, CKMB, CKMBINDEX, TROPONINI in the last 168 hours. BNP (last 3  results) No results for input(s): PROBNP in the last 8760 hours. HbA1C: No results for input(s): HGBA1C in the last 72 hours. CBG: No results for input(s): GLUCAP in the last 168 hours. Lipid Profile: No results for input(s): CHOL, HDL, LDLCALC, TRIG, CHOLHDL, LDLDIRECT in the last 72 hours. Thyroid Function Tests: Recent Labs    05/30/19 0105  TSH 8.914*   Anemia Panel: No results for input(s): VITAMINB12, FOLATE, FERRITIN, TIBC, IRON, RETICCTPCT in the last 72 hours. Sepsis Labs: No results  for input(s): PROCALCITON, LATICACIDVEN in the last 168 hours.  Recent Results (from the past 240 hour(s))  SARS CORONAVIRUS 2 (TAT 6-24 HRS) Nasopharyngeal Nasopharyngeal Swab     Status: None   Collection Time: 05/27/19  4:53 AM   Specimen: Nasopharyngeal Swab  Result Value Ref Range Status   SARS Coronavirus 2 NEGATIVE NEGATIVE Final    Comment: (NOTE) SARS-CoV-2 target nucleic acids are NOT DETECTED. The SARS-CoV-2 RNA is generally detectable in upper and lower respiratory specimens during the acute phase of infection. Negative results do not preclude SARS-CoV-2 infection, do not rule out co-infections with other pathogens, and should not be used as the sole basis for treatment or other patient management decisions. Negative results must be combined with clinical observations, patient history, and epidemiological information. The expected result is Negative. Fact Sheet for Patients: SugarRoll.be Fact Sheet for Healthcare Providers: https://www.woods-mathews.com/ This test is not yet approved or cleared by the [place] FDA and  has been authorized for detection and/or diagnosis of SARS-CoV-2 by FDA under an Emergency Use Authorization (EUA). This EUA will remain  in effect (meaning this test can be used) for the duration of the COVID-19 declaration under Section 56 4(b)(1) of the Act, 21 U.S.C. section 360bbb-3(b)(1), unless the  authorization is terminated or revoked sooner. Performed at [facility], [place] [address] [place], [place] [address]   Culture, body fluid-bottle     Status: None (Preliminary result)   Collection Time: 05/28/19  1:51 PM   Specimen: Fluid  Result Value Ref Range Status   Specimen Description FLUID PERITONEAL  Final   Special Requests   Final    BOTTLES DRAWN AEROBIC AND ANAEROBIC Blood Culture adequate volume   Culture   Final    NO GROWTH 1 DAY Performed at [facility], [place] [address], [place], [place] [address]    Report Status PENDING  Incomplete  Gram stain     Status: None   Collection Time: 05/28/19  1:51 PM   Specimen: Fluid  Result Value Ref Range Status   Specimen Description FLUID PERITONEAL  Final   Special Requests NONE  Final   Gram Stain   Final    NO WBC SEEN NO ORGANISMS SEEN Performed at [facility], [address] [place], [place] [address]    Report Status 05/29/2019 FINAL  Final  Culture, blood (routine x 2)     Status: None (Preliminary result)   Collection Time: 05/30/19  1:05 AM   Specimen: BLOOD  Result Value Ref Range Status   Specimen Description   Final    BLOOD RIGHT ANTECUBITAL Performed at [facility] [address], [place], [place] [address]    Special Requests   Final    BOTTLES DRAWN AEROBIC AND ANAEROBIC Blood Culture adequate volume Performed at [facility] [address], [place], [place] [address]    Culture   Final    NO GROWTH < 12 HOURS Performed at [facility] [address], [place], [place] [address]    Report Status PENDING  Incomplete  Culture, blood (routine x 2)     Status: None (Preliminary result)   Collection Time: 05/30/19  1:05 AM   Specimen: BLOOD  Result Value Ref Range Status   Specimen Description   Final    BLOOD LEFT ANTECUBITAL Performed at [facility] [address], [place], [place] [address]    Special Requests  Final    BOTTLES DRAWN AEROBIC ONLY Blood Culture adequate volume Performed at [facility] [address] [place], [place] [address]    Culture   Final    NO GROWTH < 12 HOURS Performed at [facility] [address], [place], [place] [address]    Report Status PENDING  Incomplete    Radiology Studies: Dg Chest Port 1 View  Result Date: 05/30/2019 CLINICAL DATA:  Fever.  Chemotherapy. EXAM: PORTABLE CHEST 1 VIEW COMPARISON:  Radiograph yesterday. CT 05/27/2019 FINDINGS: Axis left chest port remains in place. Lower lung volumes from prior exam. Increasing hazy bibasilar opacities. Increasing perivascular haziness/vascular congestion. Changed heart size and mediastinal contours. Aortic atherosclerosis and tortuosity. No pneumothorax. IMPRESSION: 1. Increasing hazy bibasilar opacities, likely combination of pleural effusions a compressive atelectasis. Difficult to exclude superimposed pneumonia. 2. Increasing perivascular haziness/vascular congestion. 3. Aortic atherosclerosis. Electronically Signed   By: Keith Rake M.D.   On: 05/30/2019 01:18   Dg Chest Port 1 View  Result Date: 05/29/2019 CLINICAL DATA:  Shortness of breath EXAM: PORTABLE CHEST 1 VIEW COMPARISON:  Chest CT from 2 days ago FINDINGS: Normal heart size and unremarkable mediastinal contours. Left-sided port with tip in good position. Bands of opacity with haziness and costophrenic sulcus blunting greater on the left. No edema or pneumothorax. IMPRESSION: Atelectasis and small pleural effusions. Aeration has improved from 2 days ago. Electronically Signed   By: Monte Fantasia M.D.   On: 05/29/2019 05:16   Scheduled Meds: . apixaban  5 mg Oral BID  . Chlorhexidine Gluconate Cloth  6 each Topical Daily  . feeding supplement (ENSURE ENLIVE)  237 mL Oral BID BM  . feeding supplement (PRO-STAT SUGAR FREE 64)  30 mL Oral QID  . finasteride  5 mg Oral Daily  . fluticasone  2 spray Each Nare Daily  .  folic acid  1 mg Oral Daily  . furosemide  20 mg Oral BID  . gabapentin  600 mg Oral BID  . ipratropium-albuterol  3 mL Nebulization Q6H  . loratadine  10 mg Oral Daily  . multivitamin with minerals  1 tablet Oral Daily  . sodium chloride flush  10-40 mL Intracatheter Q12H  . tamsulosin  0.4 mg Oral Daily  . thiamine  100 mg Oral Daily  . topiramate  50 mg Oral QHS  . vitamin B-12  1,000 mcg Oral Daily   Continuous Infusions: . [START ON 05/31/2019] cefTRIAXone (ROCEPHIN)  IV       LOS: 3 days   Kerney Elbe, DO [facility] PAGER is on [place]  If 7PM-7AM, please contact night-coverage [email or web address]

## 2019-05-31 ENCOUNTER — Other Ambulatory Visit: Payer: Self-pay

## 2019-05-31 ENCOUNTER — Inpatient Hospital Stay (HOSPITAL_COMMUNITY): Payer: Managed Care, Other (non HMO)

## 2019-05-31 ENCOUNTER — Encounter (HOSPITAL_COMMUNITY): Payer: Self-pay | Admitting: Internal Medicine

## 2019-05-31 DIAGNOSIS — D61818 Other pancytopenia: Secondary | ICD-10-CM | POA: Diagnosis present

## 2019-05-31 DIAGNOSIS — R509 Fever, unspecified: Secondary | ICD-10-CM

## 2019-05-31 DIAGNOSIS — B9620 Unspecified Escherichia coli [E. coli] as the cause of diseases classified elsewhere: Secondary | ICD-10-CM

## 2019-05-31 DIAGNOSIS — I82402 Acute embolism and thrombosis of unspecified deep veins of left lower extremity: Secondary | ICD-10-CM | POA: Diagnosis present

## 2019-05-31 DIAGNOSIS — Z515 Encounter for palliative care: Secondary | ICD-10-CM

## 2019-05-31 DIAGNOSIS — N400 Enlarged prostate without lower urinary tract symptoms: Secondary | ICD-10-CM

## 2019-05-31 DIAGNOSIS — R338 Other retention of urine: Secondary | ICD-10-CM | POA: Diagnosis present

## 2019-05-31 DIAGNOSIS — I824Z2 Acute embolism and thrombosis of unspecified deep veins of left distal lower extremity: Secondary | ICD-10-CM

## 2019-05-31 DIAGNOSIS — N390 Urinary tract infection, site not specified: Secondary | ICD-10-CM

## 2019-05-31 HISTORY — DX: Benign prostatic hyperplasia without lower urinary tract symptoms: N40.0

## 2019-05-31 LAB — CBC WITH DIFFERENTIAL/PLATELET
Abs Immature Granulocytes: 0.11 10*3/uL — ABNORMAL HIGH (ref 0.00–0.07)
Basophils Absolute: 0 10*3/uL (ref 0.0–0.1)
Basophils Relative: 1 %
Eosinophils Absolute: 0 10*3/uL (ref 0.0–0.5)
Eosinophils Relative: 0 %
HCT: 37.4 % — ABNORMAL LOW (ref 39.0–52.0)
Hemoglobin: 11.9 g/dL — ABNORMAL LOW (ref 13.0–17.0)
Immature Granulocytes: 4 %
Lymphocytes Relative: 7 %
Lymphs Abs: 0.2 10*3/uL — ABNORMAL LOW (ref 0.7–4.0)
MCH: 28.7 pg (ref 26.0–34.0)
MCHC: 31.8 g/dL (ref 30.0–36.0)
MCV: 90.3 fL (ref 80.0–100.0)
Monocytes Absolute: 0 10*3/uL — ABNORMAL LOW (ref 0.1–1.0)
Monocytes Relative: 2 %
Neutro Abs: 2.2 10*3/uL (ref 1.7–7.7)
Neutrophils Relative %: 86 %
Platelets: 96 10*3/uL — ABNORMAL LOW (ref 150–400)
RBC: 4.14 MIL/uL — ABNORMAL LOW (ref 4.22–5.81)
RDW: 17.5 % — ABNORMAL HIGH (ref 11.5–15.5)
WBC: 2.6 10*3/uL — ABNORMAL LOW (ref 4.0–10.5)
nRBC: 0 % (ref 0.0–0.2)

## 2019-05-31 LAB — COMPREHENSIVE METABOLIC PANEL
ALT: 32 U/L (ref 0–44)
AST: 50 U/L — ABNORMAL HIGH (ref 15–41)
Albumin: 2.2 g/dL — ABNORMAL LOW (ref 3.5–5.0)
Alkaline Phosphatase: 236 U/L — ABNORMAL HIGH (ref 38–126)
Anion gap: 9 (ref 5–15)
BUN: 39 mg/dL — ABNORMAL HIGH (ref 8–23)
CO2: 21 mmol/L — ABNORMAL LOW (ref 22–32)
Calcium: 8.5 mg/dL — ABNORMAL LOW (ref 8.9–10.3)
Chloride: 104 mmol/L (ref 98–111)
Creatinine, Ser: 1.17 mg/dL (ref 0.61–1.24)
GFR calc Af Amer: >60 mL/min (ref >60)
GFR calc non Af Amer: 59 mL/min — ABNORMAL LOW (ref >60)
Glucose, Bld: 109 mg/dL — ABNORMAL HIGH (ref 70–99)
Potassium: 3.9 mmol/L (ref 3.5–5.1)
Sodium: 134 mmol/L — ABNORMAL LOW (ref 135–145)
Total Bilirubin: 1.7 mg/dL — ABNORMAL HIGH (ref 0.3–1.2)
Total Protein: 4.8 g/dL — ABNORMAL LOW (ref 6.5–8.1)

## 2019-05-31 LAB — PHOSPHORUS: Phosphorus: 3.3 mg/dL (ref 2.5–4.6)

## 2019-05-31 LAB — SEDIMENTATION RATE: Sed Rate: 33 mm/hr — ABNORMAL HIGH (ref 0–16)

## 2019-05-31 LAB — TOTAL BILIRUBIN, BODY FLUID: Total bilirubin, fluid: 0.3 mg/dL

## 2019-05-31 LAB — MAGNESIUM: Magnesium: 2 mg/dL (ref 1.7–2.4)

## 2019-05-31 LAB — AMMONIA: Ammonia: 28 umol/L (ref 9–35)

## 2019-05-31 MED ORDER — ALBUMIN HUMAN 25 % IV SOLN
25.0000 g | Freq: Four times a day (QID) | INTRAVENOUS | Status: AC
  Administered 2019-05-31 – 2019-06-01 (×3): 25 g via INTRAVENOUS
  Filled 2019-05-31 (×4): qty 100

## 2019-05-31 MED ORDER — LIDOCAINE HCL 1 % IJ SOLN
INTRAMUSCULAR | Status: AC
  Filled 2019-05-31: qty 20

## 2019-05-31 NOTE — Procedures (Signed)
Ultrasound-guided diagnostic and therapeutic paracentesis performed yielding 3.7 liters of slightly hazy, yellow  fluid. No immediate complications.  A portion of the fluid was submitted to the lab for cytology. EBL none.

## 2019-05-31 NOTE — TOC Progression Note (Signed)
Transition of Care [facility]) - Progression Note    Patient Details  Name: Darius Ellis MRN: [id number] Date of Birth: [birth year]  [facility] CM/SW Contact  Vonna Brabson, Juliann Pulse, RN Phone Number: 05/31/2019, 2:58 PM  Clinical Narrative: Spoke to dtr [name]-d/c plans home w/hospice-chose [facility]-referral [facility]-await eval, & outcome if able to accept. dme needed:bedside table. PTAR @ d/c.      Expected Discharge Plan: Home w Hospice Care Barriers to Discharge: Continued Medical Work up  Expected Discharge Plan and Services Expected Discharge Plan: [place]   Discharge Planning Services: CM Consult Post Acute Care Choice: Hospice Living arrangements for the past 2 months: Single Family Home                                       Social Determinants of Health (SDOH) Interventions    Readmission Risk Interventions Readmission Risk Prevention Plan 05/29/2019  Transportation Screening Complete  PCP or Specialist Appt within 3-5 Days Not Complete  HRI or [place] Complete  Social Work Consult for [place] Planning/Counseling Complete  Palliative Care Screening Complete  Medication Review [employer]) Complete

## 2019-05-31 NOTE — Progress Notes (Signed)
Physical Therapy Treatment Patient Details Name: Darius Ellis MRN: [id number] DOB: May 19, 1941 Today's Date: 05/31/2019    History of Present Illness [name] is a 78 y.o. 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 CT showed no acute abnormalities    PT Comments    Pt with cognitive decline (possibly medical as well) and reports abdominal pain.  Pt unable to answer orientation questions.  Assisted pt with repositioning to comfort.  RN notified of pt's decline in cognition and mobility since previous session.  Pt may need increased assist upon d/c.    Follow Up Recommendations  SNF;Supervision/Assistance - 24 hour     Equipment Recommendations  None recommended by PT    Recommendations for Other Services       Precautions / Restrictions Precautions Precautions: Fall    Mobility  Bed Mobility Overal bed mobility: Needs Assistance             General bed mobility comments: pt required +2 total assist to reposition in bed, pt reporting abdominal pain  Transfers                    Ambulation/Gait                 Stairs             Wheelchair Mobility    Modified Rankin (Stroke Patients Only)       Balance                                            Cognition Arousal/Alertness: Awake/alert   Overall Cognitive Status: No family/caregiver present to determine baseline cognitive functioning                                 General Comments: pt with cognitive decline since previous session; unable to provide orientation questions, not answering how to make him more comfortable      Exercises      General Comments        Pertinent Vitals/Pain Pain  Assessment: Faces Faces Pain Scale: Hurts even more Pain Location: abdomen Pain Descriptors / Indicators: Moaning;Tender Pain Intervention(s): Monitored during session;Repositioned    Home Living                      Prior Function            PT Goals (current goals can now be found in the care plan section) Progress towards PT goals: Not progressing toward goals - comment    Frequency    Min 2X/week      PT Plan Current plan remains appropriate;Frequency needs to be updated    Co-evaluation              AM-PAC PT "6 Clicks" Mobility   Outcome Measure  Help needed turning from your back to your side while in a flat bed without using bedrails?: Total Help needed moving from lying on your back to sitting on the side of a flat bed without using bedrails?:  Total Help needed moving to and from a bed to a chair (including a wheelchair)?: Total Help needed standing up from a chair using your arms (e.g., wheelchair or bedside chair)?: Total Help needed to walk in hospital room?: Total Help needed climbing 3-5 steps with a railing? : Total 6 Click Score: 6    End of Session   Activity Tolerance: Patient limited by pain Patient left: in bed;with call bell/phone within reach;with bed alarm set Nurse Communication: Mobility status PT Visit Diagnosis: Other abnormalities of gait and mobility (R26.89);Muscle weakness (generalized) (M62.81)     Time: [id number] PT Time Calculation (min) (ACUTE ONLY): 10 min  Charges:  $Therapeutic Activity: 8-22 mins                    Carmelia Bake, PT, DPT Acute Rehabilitation Services Office: [phone] Pager: [phone]  Trena Platt 05/31/2019, 1:56 PM

## 2019-05-31 NOTE — Progress Notes (Signed)
Paracentesis site leaking, removed bandaid applied 2x2 dressings, will monitor

## 2019-05-31 NOTE — Progress Notes (Signed)
[facility] Documentation  Received referral for [facility] to assist pt to go home with hospice care. [facility] medical director currently reviewing pt chart to determine eligibility.   Writer spoke with pt's daughter, Vaughan Basta, who confirmed that goal is comfort and pt wishes to go home with hospice. Vaughan Basta stated that pt does not wish to continue chemo.   DME needs per Vaughan Basta are a BSC and bedside table. Stated that she is going to try to allow pt to sleep in his own bed but a hospital bed may be in need in the near future.   Please leave catheter in per family request and [facility] will provide catheter care for pt. Send pt home with DNR and please call with any questions. [facility] will work to provide admission visit as soon as pt discharges home if possible.   Thank you for the referral,  Freddie Breech, RN [facility] Liaison [phone]

## 2019-05-31 NOTE — Progress Notes (Signed)
PROGRESS NOTE    Darius Ellis  TIW:[id number] DOB: 08/12/1940 DOA: 05/27/2019 PCP: Libby Maw, MD    Brief Narrative:  On 05/27/2019 Patient presented early that morning after midnight via EMS complaining of dizziness, syncope and generalized weakness. He stated that he had chemotherapy yesterday morning for liver cancer.He was admitted after midnight by my partner and colleague Dr. Florene Glen I am in current agreement with this assessment and plan. The patient fell early this AM. Any headache, nausea, vomiting. Denies any chest pain or abdominal pain but did have some diarrhea.It is unclear if the patient passed out or not but when his daughter found him on the floor he was awake and his daughter did not see any convulsive activity and she believes he did not lose consciousness.  Hewasbrought to the ED via EMS and initial evaluation showed troponins that wereelevated at 166but nowstarted trending down. Cardiologywasconsulted and felt that chemotherapy may cause cardiotoxicity. Patient is already anticoagulated with Eliquis for his atrial fibrillation and for a Left Leg DVT.In the ED he was given 2 500 mL boluses of normal saline and had further work-up and imaging done with a CT head without contrast and a CT angio chest PE.  Patient's abdomen was more distended but he felt less short of breath slightly and underwent a repeat paracentesis  05/28/2019 and drained 5 L.  Had some acute urine retention Flomax was restarted and currently undergoing in and out catheterizations.  PT and OT recommending SNF.  Palliative care-met with family.  Initially family wanted to pursue aggressive measures and discuss hospice at a later point.  Patient however with no significant improvement and deteriorating.  Palliative care reassessing and likely home with hospice.      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 coli UTI   Fever  1 generalized weakness with recurrent falls/??  Syncopal episode Questionable etiology.  Cardiac enzymes were cycled which were elevated and however subsequently trended down.  There was some concern for orthostasis.  Patient received some fluid boluses.  SARS-CoV-2 which was done was negative.  Urinalysis unremarkable.  Head CT was negative for any acute abnormalities.  CT angiogram chest was negative for PE however did show small bilateral pleural effusions left greater than right with probable adjacent compressive atelectasis.  2D echo which was done showed a normal left ventricular right ventricular systolic function and size with right atrial pressure estimated to be less than 3 mmHg.  Patient was seen by cardiology and was felt patient's falls likely secondary to orthostasis and recommended incentive spirometry with continued anticoagulation and no further recommendations.  Patient underwent paracentesis with 5 L removed and still with some abdominal distention and ultrasound-guided paracentesis reordered per oncology.  Palliative care following as patient with a failure to thrive with no significant improvement.  2.  Dyspnea Patient noted to have progressive worsening shortness of breath which initially improved post paracentesis with 5 L removed on 05/22/2019.  2D echo which was done with normal EF  with no wall motion abnormalities.  Patient initially given some IV Lasix which was initially held due to borderline blood pressure.  Patient also received a dose of IV albumin.  Patient with  some abdominal distention and as such repeat ultrasound-guided paracentesis ordered per oncology today 05/31/2019.  Cardiology was following however has signed off.  Supportive care.  3.  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  Therapeutic ultrasound-guided paracentesis was done with 5 L removed.  Patient with some abdominal distention again today and ultrasound-guided paracentesis has been ordered per oncology.  Will place patient on IV albumin every 6 hours x1 day.  Patient with no significant improvement and seems to be deteriorating.  Palliative care following and family leaning towards home with hospice.  4.  BPH/acute urinary retention We will continue finasteride and tamsulosin.  Strict I's and O's.  And no catheter as needed.  Follow.  5.  Metastatic adenocarcinoma with unknown primary Patient noted to have undergone chemotherapy and gemcitabine/Abraxane on 05/26/2019.  Patient with no significant improvement.  Oncology following.  Palliative care following.  Family and patient seem to be leaning more towards home with hospice.  6.  Pancytopenia Likely secondary to recent chemotherapy.  Patient with no overt bleeding.  Per oncology.  7.  Left lower extremity DVT Per Dopplers of 05/12/2019.  Continue anticoagulation with Eliquis.  8.  Fever/E. coli UTI Likely tumor fever versus UTI.  Patient pancultured.  Urinalysis nitrite positive, trace leukocytes, 11-20 WBCs.  Ultrasound-guided paracentesis which was done negative for SBP.  Chest x-ray negative for any acute infiltrate and patient without a cough or significant respiratory symptoms.  Body fluid cultures negative.  Blood cultures negative to date.  Urine cultures with 10,000 colonies of E. coli.  Sensitivities pending. Patient started empirically on IV Rocephin.  9.  Hyponatremia Improved.  Follow.  10.  Goals of  care/DNR, POA Patient seen by palliative care and oncology.  Patient not improving significantly clinically and seems to be deteriorating.  Palliative care following and family seems to be leaning towards home with hospice.  Appreciate palliative care input and recommendations.     DVT prophylaxis: Apixaban Code Status: DNR Family Communication: Updated patient.  Updated daughter at bedside. Disposition Plan: Likely home with hospice.   Consultants:   Palliative care: Jobe Gibbon 05/29/2019  Cardiology: Dr. Caryl Comes 05/27/2019  Oncology: Dr. Benay Spice  Procedures:   CT angiogram chest 05/27/2019  CT head 05/27/2019  Chest x-ray 05/27/2019, 05/29/2019, 05/30/2019, 05/31/2019  Ultrasound-guided paracentesis 05/28/2019--- 5 L of slightly hazy, yellow fluid removed.  Ultrasound-guided paracentesis 05/31/2019 pending  2D echo 05/27/2019  Antimicrobials:   IV Rocephin 05/30/2019     Subjective: Patient laying in bed, daughter at bedside, palliative care nurse practitioner brushing patient's teeth.  Patient seems somewhat despondent and somewhat lethargic however alert and answering some questions appropriately.  Patient states does not feel too well.  Objective: Vitals:   05/31/19 0540 05/31/19 0851 05/31/19 0857 05/31/19 1440  BP: 115/66   113/73  Pulse: 91   94  Resp: 16   18  Temp: 98.5 F (36.9 C)   98.6 F (37 C)  TempSrc:    Oral  SpO2: 93% 93% 93% 96%  Weight:      Height:        Intake/Output Summary (Last 24 hours) at 05/31/2019 1700 Last data filed at 05/31/2019 0830 Gross per 24 hour  Intake 460 ml  Output 800 ml  Net -340 ml   Filed Weights   05/27/19 0035  Weight: 82.9 kg    Examination:  General exam: Appears calm and comfortable  Respiratory system: Clear to auscultation anterior lung fields. Respiratory effort normal. Cardiovascular system: S1 & S2 heard, RRR. No JVD, murmurs, rubs, gallops or clicks.  2-3+ bilateral lower extremity  edema left greater than right. Gastrointestinal system: Abdomen is mildly distended, somewhat soft, some diffuse tenderness to palpation, positive bowel sounds.  No rebound.  No guarding.  Central nervous system: Alert and oriented. No focal neurological deficits. Extremities: Symmetric 5 x 5 power. Skin: No rashes, lesions or ulcers Psychiatry: Judgement and insight appear normal. Mood & affect appropriate.     Data Reviewed: I have personally reviewed following labs and imaging studies  CBC: Recent Labs  Lab 05/27/19 0155 05/27/19 0205 05/28/19 0537 05/29/19 0500 05/30/19 0105 05/31/19 0403  WBC 9.4  --  7.7 8.2 6.5 2.6*  NEUTROABS 7.5  --  7.0 7.5 6.0 2.2  HGB 13.0 13.6 12.6* 11.9* 12.0* 11.9*  HCT 40.7 40.0 40.1 37.0* 37.7* 37.4*  MCV 91.7  --  93.5 90.7 92.0 90.3  PLT 194  --  168 138* 100* 96*   Basic Metabolic Panel: Recent Labs  Lab 05/26/19 0840 05/27/19 0205 05/28/19 0537 05/29/19 0500 05/30/19 0105 05/31/19 0403  NA 137 135 137 136 131* 134*  K 3.9 4.2 3.6 3.5 3.7 3.9  CL 107 106 104 105 102 104  CO2 22  --  23 21* 20* 21*  GLUCOSE 105* 111* 112* 101* 124* 109*  BUN 29* 32* 37* 31* 32* 39*  CREATININE 1.10 1.40* 1.41* 0.98 1.07 1.17  CALCIUM 8.6*  --  8.4* 8.4* 8.0* 8.5*  MG  --   --  2.3 2.1  --  2.0  PHOS  --   --  3.2 2.6 2.7 3.3   GFR: Estimated Creatinine Clearance: 57.9 mL/min (by C-G formula based on SCr of 1.17 mg/dL). Liver Function Tests: Recent Labs  Lab 05/27/19 0155 05/28/19 0537 05/29/19 0500 05/30/19 0105 05/31/19 0403  AST 52* 40 50* 59* 50*  ALT 40 31 32 36 32  ALKPHOS 268* 236* 272* 244* 236*  BILITOT 1.2 1.5* 2.1* 2.0* 1.7*  PROT 5.2* 4.8* 4.6* 4.5* 4.8*  ALBUMIN 2.5* 2.3* 2.3* 2.2* 2.2*   No results for input(s): LIPASE, AMYLASE in the last 168 hours. Recent Labs  Lab 05/31/19 1130  AMMONIA 28   Coagulation Profile: Recent Labs  Lab 05/28/19 0537  INR 1.7*   Cardiac Enzymes: No results for input(s): CKTOTAL,  CKMB, CKMBINDEX, TROPONINI in the last 168 hours. BNP (last 3 results) No results for input(s): PROBNP in the last 8760 hours. HbA1C: No results for input(s): HGBA1C in the last 72 hours. CBG: No results for input(s): GLUCAP in the last 168 hours. Lipid Profile: No results for input(s): CHOL, HDL, LDLCALC, TRIG, CHOLHDL, LDLDIRECT in the last 72 hours. Thyroid Function Tests: Recent Labs    05/30/19 0105  TSH 8.914*   Anemia Panel: No results for input(s): VITAMINB12, FOLATE, FERRITIN, TIBC, IRON, RETICCTPCT in the last 72 hours. Sepsis Labs: No results for input(s): PROCALCITON, LATICACIDVEN in the last 168 hours.  Recent Results (from the past 240 hour(s))  SARS CORONAVIRUS 2 (TAT 6-24 HRS) Nasopharyngeal Nasopharyngeal Swab     Status: None   Collection Time: 05/27/19  4:53 AM   Specimen: Nasopharyngeal Swab  Result Value Ref Range Status  SARS Coronavirus 2 NEGATIVE NEGATIVE Final    Comment: (NOTE) SARS-CoV-2 target nucleic acids are NOT DETECTED. The SARS-CoV-2 RNA is generally detectable in upper and lower respiratory specimens during the acute phase of infection. Negative results do not preclude SARS-CoV-2 infection, do not rule out co-infections with other pathogens, and should not be used as the sole basis for treatment or other patient management decisions. Negative results must be combined with clinical observations, patient history, and epidemiological information. The expected result is Negative. Fact Sheet for Patients: SugarRoll.be Fact Sheet for Healthcare Providers: https://www.woods-mathews.com/ This test is not yet approved or cleared by the [place] FDA and  has been authorized for detection and/or diagnosis of SARS-CoV-2 by FDA under an Emergency Use Authorization (EUA). This EUA will remain  in effect (meaning this test can be used) for the duration of the COVID-19 declaration under Section 56 4(b)(1) of  the Act, 21 U.S.C. section 360bbb-3(b)(1), unless the authorization is terminated or revoked sooner. Performed at [facility], [place] [address] [place], [place] [address]   Fungus Culture With Stain     Status: None (Preliminary result)   Collection Time: 05/28/19  1:51 PM   Specimen: PATH Cytology Peritoneal fluid  Result Value Ref Range Status   Fungus Stain Final report  Final    Comment: (NOTE) Performed At: [facility] [place], [place] [id number] Rush Farmer MD [id number]    Fungus (Mycology) Culture PENDING  Incomplete   Fungal Source CYTO  Final    Comment: PERITONEAL Performed at [facility], [place] [address] [place], [place] [address]   Culture, body fluid-bottle     Status: None (Preliminary result)   Collection Time: 05/28/19  1:51 PM   Specimen: Fluid  Result Value Ref Range Status   Specimen Description FLUID PERITONEAL  Final   Special Requests   Final    BOTTLES DRAWN AEROBIC AND ANAEROBIC Blood Culture adequate volume   Culture   Final    NO GROWTH 2 DAYS Performed at [facility], [place] [address], [place], [place] [address]    Report Status PENDING  Incomplete  Gram stain     Status: None   Collection Time: 05/28/19  1:51 PM   Specimen: Fluid  Result Value Ref Range Status   Specimen Description FLUID PERITONEAL  Final   Special Requests NONE  Final   Gram Stain   Final    NO WBC SEEN NO ORGANISMS SEEN Performed at [facility], [address] [place], [place] [address]    Report Status 05/29/2019 FINAL  Final  Fungus Culture Result     Status: None   Collection Time: 05/28/19  1:51 PM  Result Value Ref Range Status   Result 1 Comment  Final    Comment: (NOTE) KOH/Calcofluor preparation:  no fungus observed. Performed At: [facility] [place], [place] [id number] Rush Farmer MD [id number]   Culture, blood (routine x 2)     Status: None (Preliminary  result)   Collection Time: 05/30/19  1:05 AM   Specimen: BLOOD  Result Value Ref Range Status   Specimen Description   Final    BLOOD RIGHT ANTECUBITAL Performed at [place] [address], [place], [place] [address]    Special Requests   Final    BOTTLES DRAWN AEROBIC AND ANAEROBIC Blood Culture adequate volume Performed at [place] [address], [place], [place] [address]  Culture   Final    NO GROWTH 1 DAY Performed at [facility], [place] [address] [place], [place] [address]    Report Status PENDING  Incomplete  Culture, blood (routine x 2)     Status: None (Preliminary result)   Collection Time: 05/30/19  1:05 AM   Specimen: BLOOD  Result Value Ref Range Status   Specimen Description   Final    BLOOD LEFT ANTECUBITAL Performed at [place] [address], [place], [place] [address]    Special Requests   Final    BOTTLES DRAWN AEROBIC ONLY Blood Culture adequate volume Performed at [place] [address] [place], [place] [address]    Culture   Final    NO GROWTH 1 DAY Performed at [facility], [place] [address] [place], [place] [address]    Report Status PENDING  Incomplete  Culture, Urine     Status: Abnormal (Preliminary result)   Collection Time: 05/30/19  8:31 AM   Specimen: Urine, Clean Catch  Result Value Ref Range Status   Specimen Description   Final    URINE, CLEAN CATCH Performed at [facility], [place] [address], [place], [place] [address]    Special Requests   Final    NONE Performed at [facility], [place] [address], [place], [place] [address]    Culture (A)  Final    10,000 COLONIES/mL ESCHERICHIA COLI SUSCEPTIBILITIES TO FOLLOW Performed at [facility], [place] [address] [place],  [address]    Report Status PENDING  Incomplete         Radiology Studies: Dg Chest Port 1 View  Result  Date: 05/31/2019 CLINICAL DATA:  Shortness of breath. EXAM: PORTABLE CHEST 1 VIEW COMPARISON:  Radiograph yesterday. CT 05/27/2019 FINDINGS: Left chest port remains in place. Unchanged heart size and mediastinal contours. Slight worsening left basilar opacity likely combination of pleural fluid and airspace disease. Slight worsening in streaky right infrahilar opacities. No pulmonary edema or pneumothorax. IMPRESSION: 1. Slight worsening left basilar opacity likely combination of pleural fluid and airspace disease. 2. Slight worsening streaky right infrahilar opacities, favor atelectasis. Small right pleural effusion on CT is not well seen radiographically. Electronically Signed   By: Keith Rake M.D.   On: 05/31/2019 05:23   Dg Chest Port 1 View  Result Date: 05/30/2019 CLINICAL DATA:  Fever.  Chemotherapy. EXAM: PORTABLE CHEST 1 VIEW COMPARISON:  Radiograph yesterday. CT 05/27/2019 FINDINGS: Axis left chest port remains in place. Lower lung volumes from prior exam. Increasing hazy bibasilar opacities. Increasing perivascular haziness/vascular congestion. Changed heart size and mediastinal contours. Aortic atherosclerosis and tortuosity. No pneumothorax. IMPRESSION: 1. Increasing hazy bibasilar opacities, likely combination of pleural effusions a compressive atelectasis. Difficult to exclude superimposed pneumonia. 2. Increasing perivascular haziness/vascular congestion. 3. Aortic atherosclerosis. Electronically Signed   By: Keith Rake M.D.   On: 05/30/2019 01:18        Scheduled Meds: . apixaban  5 mg Oral BID  . Chlorhexidine Gluconate Cloth  6 each Topical Daily  . feeding supplement (ENSURE ENLIVE)  237 mL Oral BID BM  . feeding supplement (PRO-STAT SUGAR FREE 64)  30 mL Oral QID  . finasteride  5 mg Oral Daily  . fluticasone  2 spray Each Nare Daily  . folic acid  1 mg Oral Daily  . furosemide  20 mg Oral BID  . gabapentin  600 mg Oral BID  . ipratropium-albuterol  3 mL  Nebulization TID  . lidocaine      . loratadine  10 mg Oral Daily  . multivitamin with minerals  1 tablet Oral Daily  . sodium chloride flush  10-40 mL Intracatheter Q12H  . tamsulosin  0.4 mg Oral Daily  . thiamine  100 mg Oral Daily  . topiramate  50 mg Oral QHS  . vitamin B-12  1,000 mcg Oral Daily   Continuous Infusions: . albumin human 25 g (05/31/19 1338)  . cefTRIAXone (ROCEPHIN)  IV 2 g (05/31/19 0122)     LOS: 4 days    Time spent: 35 minutes    Irine Seal, MD [facility]  If 7PM-7AM, please contact night-coverage [email or web address] 05/31/2019, 5:00 PM

## 2019-05-31 NOTE — Care Management Important Message (Signed)
Important Message  Patient Details IM Letter given to Dessa Phi RN Case Manager to present to the Patient Name: Darius Ellis MRN: [id number] Date of Birth: 1940-12-10   Medicare Important Message Given:  Yes     [name] 05/31/2019, 10:48 AM

## 2019-05-31 NOTE — Progress Notes (Signed)
Daily Progress Note   Patient Name: Darius Ellis       Date: 05/31/2019 DOB: 01/08/1941  Age: 78 y.o. MRN#: [id number] Attending Physician: Eugenie Filler, MD Primary Care Physician: Libby Maw, MD Admit Date: 05/27/2019  Reason for Consultation/Follow-up: Establishing goals of care  Subjective: Patient resting in bed. Denies pain or shortness of breath however throughout assessment patient observed grimacing and moaning at times. He is somewhat somnolent but easily arouses. He answers orientation questions appropriately however, he initially expressed his location was in [facility] later confirming awareness he was in [place] at the hospital. Daughter at the bedside and requesting to further discuss goals of care. She expresses concerns that her father remains extremely weak and fatigued. She shares her thoughts that he will not get better to resume chemotherapy and does not want him to suffer or continue in such a debilitating state knowing the man that he was before. Therapeutic listening provided.   Darius Ellis states she was with him most of the day yesterday and brought him a shake from home and also one today. She reports the entire day he only drank a few sips from the shake but that is all that he was interested in eating. This has been the case today as well. Darius Ellis expresses if he does not wish to eat she is accepting of this and her main goal would be to get him back home with hospice and for comfort before declines further. Therapeutic listening and support given.   I was able to have a discussion with Darius Ellis. He expressed his concerns with his health, weakness, and having no appetite. Daughter is asking him to share what he wants for himself at this point stating her goal is to  support him and do what he felt was best. Darius Ellis is slow to respond and requires prompting to make sure he understood the question. He verbalized his understanding expressing he was thinking about how to respond. He states he wishes to go home with his daughter and have hospice care. I educate patient that with his request to go home with hospice this would mean he is not interested in further chemotherapy. He replied "that is correct and I am fine with that!" He closes eyes and silence is noted for several minutes. Space and opportunity allowed to discuss  with patient what his thoughts were and how he was feeling. He states he cannot describe how he was feeling but that he was ok. He states he is not sad or afraid of dying. Darius Ellis asked if he was worried about anything or his family. He denied and stated "all is and will be well!" I explained the goals and philosophy of hospice outpatient. Patient and daughter verbalized understanding expressing their previous experience with hospice. Patients long-term girlfriend passed away over a year ago and he cared for her in the home with hospice support.   We discussed care while patient remains hospitalized given wishes are to discharge home with hospice. Daughter requesting to continue with antibiotics and medications until discharged. She is in agreement for no escalation of care, discontinuing lab, radiology testing, telemetry, and frequent vitals. Proceed with paracentesis for comfort.   Daughter is requesting a bedside commode in the home. We discussed the need for hospital bed but she feels at this time they are ok and aware she may request for one at any time. We discussed transporting patient home. Daughter is unsure at this time and trying to decide if she will need assistance or if patient will be strong enough to transport by car and get up the steps. She plans to think about it and will let the case manager know.    Length of Stay: 4  Current Medications:  Scheduled Meds:  . apixaban  5 mg Oral BID  . Chlorhexidine Gluconate Cloth  6 each Topical Daily  . feeding supplement (ENSURE ENLIVE)  237 mL Oral BID BM  . feeding supplement (PRO-STAT SUGAR FREE 64)  30 mL Oral QID  . finasteride  5 mg Oral Daily  . fluticasone  2 spray Each Nare Daily  . folic acid  1 mg Oral Daily  . furosemide  20 mg Oral BID  . gabapentin  600 mg Oral BID  . ipratropium-albuterol  3 mL Nebulization TID  . loratadine  10 mg Oral Daily  . multivitamin with minerals  1 tablet Oral Daily  . sodium chloride flush  10-40 mL Intracatheter Q12H  . tamsulosin  0.4 mg Oral Daily  . thiamine  100 mg Oral Daily  . topiramate  50 mg Oral QHS  . vitamin B-12  1,000 mcg Oral Daily    Continuous Infusions: . albumin human    . cefTRIAXone (ROCEPHIN)  IV 2 g (05/31/19 0122)    PRN Meds: albuterol, HYDROcodone-acetaminophen, ondansetron **OR** ondansetron (ZOFRAN) IV, senna-docusate, sodium chloride flush  Physical Exam     -somnolent but easily aroused, alert, oriented, thin, chronically-ill appearing.  -RRR -diminished bilaterally -abdomen tender, tight, distended, +bs x4      Vital Signs: BP 115/66 (BP Location: Right Arm)   Pulse 91   Temp 98.5 F (36.9 C)   Resp 16   Ht 6\' 1"  (1.854 m)   Wt 82.9 kg   SpO2 93%   BMI 24.12 kg/m  SpO2: SpO2: 93 % O2 Device: O2 Device: Room Air O2 Flow Rate:    Intake/output summary:   Intake/Output Summary (Last 24 hours) at 05/31/2019 1136 Last data filed at 05/31/2019 0830 Gross per 24 hour  Intake 460 ml  Output 800 ml  Net -340 ml   LBM: Last BM Date: 05/30/19 Baseline Weight: Weight: 82.9 kg Most recent weight: Weight: 82.9 kg       Palliative Assessment/Data: PPS 10-20%      Patient Active Problem List   Diagnosis  Date Noted  . Abdominal ascites   . Anasarca   . Cancer ([place])   . SOB (shortness of breath)   . Syncope and collapse 05/27/2019  . Generalized weakness 05/27/2019  . Do not  resuscitate 05/27/2019  . Dehydration   . Elevated troponin   . Malignant neoplasm of liver ([place])   . Port-A-Cath in place 05/26/2019  . 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 Goals of care, counseling/discussion 05/05/2019    Palliative Care Assessment & Plan   Recommendations/Plan:  Continue current plan of care with a focus on comfort, no escalation of care   Patient and daughter now requesting home hospice for EOL care, has decided not to proceed with further chemo. Dr. Benay Spice aware.   Outpatient hospice referral. Will need BSC, possible non-ems transport home.   Daughter request to continue with current medications, paracentesis for comfort. D/c telemetry, labs, frequent vitals.   PMT will continue to support and follow as needed.   Goals of Care and Additional Recommendations:  Limitations on Scope of Treatment: Minimize Medications, No Lab Draws and Continue current medications, no escalation of care, minimize interventions with a goal of comfort   Code Status:    Code Status Orders  (From admission, onward)         Start     Ordered   05/27/19 0737  Do not attempt resuscitation (DNR)  Continuous    Question Answer Comment  In the event of cardiac or respiratory ARREST Do not call a "code blue"   In the event of cardiac or respiratory ARREST Do not perform Intubation, CPR, defibrillation or ACLS   In the event of cardiac or respiratory ARREST Use medication by any route, position, wound care, and other measures to relive pain and suffering. May use oxygen, suction and manual treatment of airway obstruction as needed for comfort.   Comments DW patient and daughter.  They request DNR status      05/27/19 0738        Code Status History    This patient has a current code status but no historical code status.   Advance Care Planning Activity    Advance Directive Documentation     Most Recent Value  Type of Advance  Directive  Healthcare Power of Attorney, Living will  Pre-existing out of facility DNR order (yellow form or pink MOST form)  -  "MOST" Form in Place?  -     Prognosis:   < 6 weeks in the setting of metastatic liver cancer with unknown primary, recurrent ascites s/p paracentesis (5L), generalized weakness, falls, BPH, thrombocytopenia, albumin 2.2, poor po intake, patient and daughter requesting care to focus on comfort with no aggressive interventions.   Discharge Planning:  Home with Hospice  Care plan was discussed with patient, patient's daughter, RN,  Dr. Grandville Silos at the bedside, and Dr. Benay Spice.   Thank you for allowing the Palliative Medicine Team to assist in the care of this patient.   Time In: 1105 Time Out: 1215 Total Time 70 min.  Prolonged Time Billed YES       Greater than 50%  of this time was spent counseling and coordinating care related to the above assessment and plan.  Alda Lea, AGPCNP-BC Palliative Medicine Team   Please contact Palliative Medicine Team phone at [phone] for questions and concerns.

## 2019-05-31 NOTE — Progress Notes (Addendum)
HEMATOLOGY-ONCOLOGY PROGRESS NOTE  SUBJECTIVE: Tmax 99.2. In the past 24 hours.  The patient is sleeping during most of the time that I was in the room.  His daughter is at the bedside.  The patient does complain of some increased abdominal distention and discomfort.  He offers no other complaints today.  He is not really eating much and is not fully participating with therapy.  Daughter has expressed concern about being able to get him to appointments due to his weakness.  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 [facility])   05/12/2019 -  Chemotherapy   The patient had PACLitaxel-protein bound (ABRAXANE) chemo infusion 200 mg, 100 mg/m2 = 200 mg (100 % of original dose 100 mg/m2), Intravenous,  Once, 1 of 4 cycles Dose modification: 100 mg/m2 (original dose 100 mg/m2, Cycle 1, Reason: Provider Judgment) Administration: 200 mg (05/12/2019), 200 mg (05/26/2019) gemcitabine (GEMZAR) 1,672 mg in sodium chloride 0.9 % 250 mL chemo infusion, 800 mg/m2 = 1,672 mg (100 % of original dose 800 mg/m2), Intravenous,  Once, 1 of 4 cycles Dose modification: 800 mg/m2 (original dose 800 mg/m2, Cycle 1, Reason: Provider Judgment) Administration: 1,672 mg (05/12/2019), 1,672 mg (05/26/2019)  for chemotherapy treatment.      PHYSICAL EXAMINATION:  Vitals:   05/31/19 0851 05/31/19 0857  BP:    Pulse:    Resp:    Temp:    SpO2: 93% 93%   Filed Weights   05/27/19 0035  Weight: 182 lb 12.9 oz (82.9 kg)    Intake/Output from previous day: 12/08 0701 - 12/09 0700 In: 220 [P.O.:120; IV Piggyback:100] Out: 800 [Urine:800]  GENERAL:alert, no distress and comfortable OROPHARYNX: No thrush or mucositis LUNGS: Diminished breath sounds at the lung bases, no respiratory distress HEART: regular rate & rhythm, pitting edema to the bilateral lower extremities, left greater than  right ABDOMEN: Positive bowel sounds, increased abdominal distention this morning, liver palpable in the right upper abdomen NEURO: alert & oriented x 3 with fluent speech, no focal motor/sensory deficits  LABORATORY DATA:  I have reviewed the data as listed CMP Latest Ref Rng & Units 05/31/2019 05/30/2019 05/29/2019  Glucose 70 - 99 mg/dL 109(H) 124(H) 101(H)  BUN 8 - 23 mg/dL 39(H) 32(H) 31(H)  Creatinine 0.61 - 1.24 mg/dL 1.17 1.07 0.98  Sodium 135 - 145 mmol/L 134(L) 131(L) 136  Potassium 3.5 - 5.1 mmol/L 3.9 3.7 3.5  Chloride 98 - 111 mmol/L 104 102 105  CO2 22 - 32 mmol/L 21(L) 20(L) 21(L)  Calcium 8.9 - 10.3 mg/dL 8.5(L) 8.0(L) 8.4(L)  Total Protein 6.5 - 8.1 g/dL 4.8(L) 4.5(L) 4.6(L)  Total Bilirubin 0.3 - 1.2 mg/dL 1.7(H) 2.0(H) 2.1(H)  Alkaline Phos 38 - 126 U/L 236(H) 244(H) 272(H)  AST 15 - 41 U/L 50(H) 59(H) 50(H)  ALT 0 - 44 U/L 32 36 32    Lab Results  Component Value Date   WBC 2.6 (L) 05/31/2019   HGB 11.9 (L) 05/31/2019   HCT 37.4 (L) 05/31/2019   MCV 90.3 05/31/2019   PLT 96 (L) 05/31/2019   NEUTROABS 2.2 05/31/2019    Ct Head Wo Contrast  Result Date: 05/27/2019 CLINICAL DATA:  Lightheadedness.  Syncope. EXAM: CT HEAD WITHOUT CONTRAST TECHNIQUE: Contiguous axial images were obtained from the base of the skull through the vertex without intravenous contrast. COMPARISON:  April 10, 2019 FINDINGS: Brain: No subdural, epidural, or subarachnoid  hemorrhage. Cerebellum, brainstem, and basal sys cisterns are normal. 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 No acute cortical ischemia infarct noted. No mass effect or midline shift. Ventricles and sulci are stable. Vascular: No hyperdense vessel or unexpected calcification. Skull: Normal. Negative for fracture or focal lesion. Sinuses/Orbits: No acute finding. Other: None. IMPRESSION: No acute intracranial abnormalities. Electronically Signed   By: Dorise Bullion III M.D   On: 05/27/2019 03:17   Ct Chest W  Contrast  Addendum Date: 05/12/2019   ADDENDUM REPORT: 05/12/2019 10:36 ADDENDUM: Left-sided Port-A-Cath appears to terminate in the low SVC. It is a small bore catheter and is somewhat obscured by the contrast bolus, however. If further evaluation is desired, PA and lateral views of the chest are recommended. Electronically Signed   By: Lorin Picket M.D.   On: 05/12/2019 10:36   Addendum Date: 05/04/2019   ADDENDUM REPORT: 05/04/2019 12:09 ADDENDUM: Outside CT abdomen/pelvis dated 02/01/2019 submitted for comparison. The liver lesions have significantly increased in number and are stable to increased in size since 02/01/2019 CT, compatible with progression of metastatic disease to the liver. Representative central right liver 7.1 x 3.8 cm mass (series 2/image 65), increased from 4.0 x 2.6 cm. Representative segment 4A left liver lobe 6.9 x 6.3 cm mass (series 2/image 56), previously 6.9 x 6.4 cm, stable. Representative segment 2 left liver lobe 3.5 x 2.8 cm mass (series 2/image 52), previously 3.6 x 2.4 cm, stable. Representative new right liver dome 2.0 x 2.0 cm mass (series 2/image 46). Representative inferior right liver lobe 3.8 x 3.1 cm mass (series 2/image 64), increased from 2.1 x 2.0 cm. The enlarged gastrohepatic ligament, portacaval, left para-aortic and aortocaval lymph nodes appear stable. The anasarca, ascites and pleural effusions are essentially new. Electronically Signed   By: Ilona Sorrel M.D.   On: 05/04/2019 12:09   Result Date: 05/12/2019 CLINICAL DATA:  Metastatic adenocarcinoma of unknown primary, reportedly most likely pancreatic or biliary or upper GI origin per 11/30/2018 liver mass biopsy. Status post chemotherapy, left liver lobe radioembolization 03/03/2019 and right liver lobe radioembolization 03/31/2019. Restaging. [place]. EXAM: CT CHEST, ABDOMEN, AND PELVIS WITH CONTRAST TECHNIQUE: Multidetector CT imaging of the chest, abdomen and  pelvis was performed following the standard protocol during bolus administration of intravenous contrast. CONTRAST:  161m OMNIPAQUE IOHEXOL 300 MG/ML  SOLN COMPARISON:  None available at the time of this interpretation. FINDINGS: CT CHEST FINDINGS Cardiovascular: Normal heart size. No significant pericardial effusion/thickening. Three-vessel coronary atherosclerosis. Atherosclerotic nonaneurysmal thoracic aorta. Normal caliber pulmonary arteries. No central pulmonary emboli. Mediastinum/Nodes: Hypodense 1.4 cm left thyroid nodule. Unremarkable esophagus. No pathologically enlarged axillary, mediastinal or hilar lymph nodes. Lungs/Pleura: No pneumothorax. Small dependent right pleural effusion. Moderate dependent left pleural effusion. Moderate passive dependent left lower lobe atelectasis. Mild passive dependent right lower lobe and lingular atelectasis. No acute consolidative airspace disease, lung masses or significant pulmonary nodules. Musculoskeletal: Mild thoracic spondylosis. Small sclerotic foci in the lateral right ninth through twelfth ribs, with the appearance of nearly healed fractures. CT ABDOMEN PELVIS FINDINGS Hepatobiliary: There are numerous (greater than 20) similar peripherally enhancing liver masses scattered throughout the liver. Representative 7.1 x 3.8 cm central right liver mass (series 2/image 65). Representative 6.9 x 6.3 cm segment 4A left liver lobe mass (series 2/image 56). Representative 3.4 x 2.7 cm segment 2 left liver lobe mass (series 2/image 53). Cholecystectomy. No biliary ductal dilatation. Pancreas: Normal, with no mass or duct dilation. Spleen:  Normal size. No mass. Adrenals/Urinary Tract: Normal adrenals. Subcentimeter hypodense right renal cortical lesions are too small to characterize. Otherwise normal kidneys, with no hydronephrosis. Nondistended bladder obscured by streak artifact from left hip hardware. Stomach/Bowel: Normal non-distended stomach. Normal caliber small  bowel with no small bowel wall thickening. Appendix not discretely visualized. Oral contrast transits to the colon. Normal large bowel with no diverticulosis, large bowel wall thickening or pericolonic fat stranding. Vascular/Lymphatic: Atherosclerotic nonaneurysmal abdominal aorta. Patent portal, splenic, hepatic and renal veins. Enlarged gastrohepatic ligament nodes up to 1.2 cm (series 2/image 61). Enlarged 1.5 cm portacaval node (series 2/image 69). Enlarged 1.2 cm left para-aortic node (series 2/ image 72). Enlarged 1.2 cm aortocaval node (series 2/image 76). No pelvic adenopathy. Reproductive: Top-normal size prostate, obscured by streak artifact. Other: Moderate to large volume ascites. No focal fluid collection. Ascitic fluid trapped within small right inguinal hernia sac. Mild anasarca. Musculoskeletal: No aggressive appearing focal osseous lesions. Partially visualized left total hip arthroplasty and surgical fixation hardware in the proximal right femur. Marked lumbar spondylosis. IMPRESSION: 1. Numerous peripherally enhancing liver masses throughout the liver, compatible with viable liver metastases. If the outside prior studies are submitted for comparison, an addendum can be issued at that time. 2. Mildly enlarged lymph nodes throughout the gastrohepatic ligament, portacaval, aortocaval and left para-aortic change suspicious for metastatic disease. 3. Findings suggesting third-spacing of fluid including mild anasarca, moderate to large volume ascites and moderate left and small right dependent pleural effusions. 4.  Aortic Atherosclerosis (ICD10-I70.0). Electronically Signed: By: Ilona Sorrel M.D. On: 05/02/2019 18:12   Ct Angio Chest Pe W And/or Wo Contrast  Result Date: 05/27/2019 CLINICAL DATA:  Dizziness and syncope EXAM: CT ANGIOGRAPHY CHEST WITH CONTRAST TECHNIQUE: Multidetector CT imaging of the chest was performed using the standard protocol during bolus administration of intravenous  contrast. Multiplanar CT image reconstructions and MIPs were obtained to evaluate the vascular anatomy. CONTRAST:  137m OMNIPAQUE IOHEXOL 350 MG/ML SOLN COMPARISON:  May 02, 2019 FINDINGS: Cardiovascular: There is a optimal opacification of the pulmonary arteries. There is no central,segmental, or subsegmental filling defects within the pulmonary arteries. The heart is normal in size. No pericardial effusion or thickening. No evidence right heart strain. There is normal three-vessel brachiocephalic anatomy without proximal stenosis. Scattered aortic atherosclerotic calcifications are seen without aneurysmal dilatation. Mediastinum/Nodes: No hilar, mediastinal, or axillary adenopathy. Again noted is a 1.4 cm low-density lesion within the left thyroid lobe. Lungs/Pleura: There are small bilateral pleural effusions, left greater right. Patchy/streaky airspace opacity seen at both lung bases which could be compressive atelectasis. Upper Abdomen: There is partially visualized multiple hypodense lesions seen throughout the liver. A moderate amount of abdominal ascites is seen. For Musculoskeletal: No chest wall abnormality. No acute or significant osseous findings. Anterior flowing osteophytes seen throughout the thoracic spine. Review of the MIP images confirms the above findings. IMPRESSION: 1. no central, segmental, or subsegmental pulmonary embolism. 2. Small bilateral pleural effusions, left greater than right with probable adjacent compressive atelectasis. 3. Partially visualized multiple hypodense liver lesions. 4. Moderate abdominal ascites. Electronically Signed   By: BPrudencio PairM.D.   On: 05/27/2019 03:40   UKoreaAbdomen Complete  Result Date: 05/27/2019 CLINICAL DATA:  Acute renal failure, ascites metastatic disease to the liver. EXAM: ABDOMEN ULTRASOUND COMPLETE COMPARISON:  05/27/2019 and 05/02/2019 FINDINGS: Gallbladder: Gallbladder is contracted with wall thickening in the setting of generalized  edema. No sonographic Murphy's sign reported by the sonographer. Common bile duct: Diameter: 3 mm Liver: Diffuse  heterogeneity of the liver with multiple areas of hypo coexisting metastatic disease. These are better displayed on recent CT evaluations. Portal vein is patent on color Doppler imaging with normal direction of blood flow towards the liver. IVC: No abnormality visualized. Pancreas: Visualized portion unremarkable. Spleen: Size and appearance within normal limits. Right Kidney: Length: 9.5 cm. Cortical thinning. No signs of hydronephrosis. Renal sinus lipomatosis. Hypoechoic renal lesions likely small proteinaceous or hemorrhagic cysts unchanged from recent CT of the chest, abdomen and pelvis. Left Kidney: Length: 11.5 cm. Limited assessment of the left kidney. Visualized portions are normal. No visible hydronephrosis. Abdominal aorta: Distal portion of the aorta at the bifurcation is not well visualized. No signs of aneurysm and visible portions. Other findings: Ascites present in all 4 quadrants. Signs of bowel edema, generalized likely related to underlying anasarca. IMPRESSION: 1. Multiple metastatic lesions in the liver. 2. Signs of anasarca with ascites and generalized edema. 3. No signs of biliary ductal dilation. Electronically Signed   By: Zetta Bills M.D.   On: 05/27/2019 11:09   Ct Abdomen Pelvis W Contrast  Addendum Date: 05/12/2019   ADDENDUM REPORT: 05/12/2019 10:36 ADDENDUM: Left-sided Port-A-Cath appears to terminate in the low SVC. It is a small bore catheter and is somewhat obscured by the contrast bolus, however. If further evaluation is desired, PA and lateral views of the chest are recommended. Electronically Signed   By: Lorin Picket M.D.   On: 05/12/2019 10:36   Addendum Date: 05/04/2019   ADDENDUM REPORT: 05/04/2019 12:09 ADDENDUM: Outside CT abdomen/pelvis dated 02/01/2019 submitted for comparison. The liver lesions have significantly increased in number and are  stable to increased in size since 02/01/2019 CT, compatible with progression of metastatic disease to the liver. Representative central right liver 7.1 x 3.8 cm mass (series 2/image 65), increased from 4.0 x 2.6 cm. Representative segment 4A left liver lobe 6.9 x 6.3 cm mass (series 2/image 56), previously 6.9 x 6.4 cm, stable. Representative segment 2 left liver lobe 3.5 x 2.8 cm mass (series 2/image 52), previously 3.6 x 2.4 cm, stable. Representative new right liver dome 2.0 x 2.0 cm mass (series 2/image 46). Representative inferior right liver lobe 3.8 x 3.1 cm mass (series 2/image 64), increased from 2.1 x 2.0 cm. The enlarged gastrohepatic ligament, portacaval, left para-aortic and aortocaval lymph nodes appear stable. The anasarca, ascites and pleural effusions are essentially new. Electronically Signed   By: Ilona Sorrel M.D.   On: 05/04/2019 12:09   Result Date: 05/12/2019 CLINICAL DATA:  Metastatic adenocarcinoma of unknown primary, reportedly most likely pancreatic or biliary or upper GI origin per 11/30/2018 liver mass biopsy. Status post chemotherapy, left liver lobe radioembolization 03/03/2019 and right liver lobe radioembolization 03/31/2019. Restaging. [place]. EXAM: CT CHEST, ABDOMEN, AND PELVIS WITH CONTRAST TECHNIQUE: Multidetector CT imaging of the chest, abdomen and pelvis was performed following the standard protocol during bolus administration of intravenous contrast. CONTRAST:  159m OMNIPAQUE IOHEXOL 300 MG/ML  SOLN COMPARISON:  None available at the time of this interpretation. FINDINGS: CT CHEST FINDINGS Cardiovascular: Normal heart size. No significant pericardial effusion/thickening. Three-vessel coronary atherosclerosis. Atherosclerotic nonaneurysmal thoracic aorta. Normal caliber pulmonary arteries. No central pulmonary emboli. Mediastinum/Nodes: Hypodense 1.4 cm left thyroid nodule. Unremarkable esophagus. No pathologically enlarged axillary,  mediastinal or hilar lymph nodes. Lungs/Pleura: No pneumothorax. Small dependent right pleural effusion. Moderate dependent left pleural effusion. Moderate passive dependent left lower lobe atelectasis. Mild passive dependent right lower lobe and lingular atelectasis. No acute  consolidative airspace disease, lung masses or significant pulmonary nodules. Musculoskeletal: Mild thoracic spondylosis. Small sclerotic foci in the lateral right ninth through twelfth ribs, with the appearance of nearly healed fractures. CT ABDOMEN PELVIS FINDINGS Hepatobiliary: There are numerous (greater than 20) similar peripherally enhancing liver masses scattered throughout the liver. Representative 7.1 x 3.8 cm central right liver mass (series 2/image 65). Representative 6.9 x 6.3 cm segment 4A left liver lobe mass (series 2/image 56). Representative 3.4 x 2.7 cm segment 2 left liver lobe mass (series 2/image 53). Cholecystectomy. No biliary ductal dilatation. Pancreas: Normal, with no mass or duct dilation. Spleen: Normal size. No mass. Adrenals/Urinary Tract: Normal adrenals. Subcentimeter hypodense right renal cortical lesions are too small to characterize. Otherwise normal kidneys, with no hydronephrosis. Nondistended bladder obscured by streak artifact from left hip hardware. Stomach/Bowel: Normal non-distended stomach. Normal caliber small bowel with no small bowel wall thickening. Appendix not discretely visualized. Oral contrast transits to the colon. Normal large bowel with no diverticulosis, large bowel wall thickening or pericolonic fat stranding. Vascular/Lymphatic: Atherosclerotic nonaneurysmal abdominal aorta. Patent portal, splenic, hepatic and renal veins. Enlarged gastrohepatic ligament nodes up to 1.2 cm (series 2/image 61). Enlarged 1.5 cm portacaval node (series 2/image 69). Enlarged 1.2 cm left para-aortic node (series 2/ image 72). Enlarged 1.2 cm aortocaval node (series 2/image 76). No pelvic adenopathy.  Reproductive: Top-normal size prostate, obscured by streak artifact. Other: Moderate to large volume ascites. No focal fluid collection. Ascitic fluid trapped within small right inguinal hernia sac. Mild anasarca. Musculoskeletal: No aggressive appearing focal osseous lesions. Partially visualized left total hip arthroplasty and surgical fixation hardware in the proximal right femur. Marked lumbar spondylosis. IMPRESSION: 1. Numerous peripherally enhancing liver masses throughout the liver, compatible with viable liver metastases. If the outside prior studies are submitted for comparison, an addendum can be issued at that time. 2. Mildly enlarged lymph nodes throughout the gastrohepatic ligament, portacaval, aortocaval and left para-aortic change suspicious for metastatic disease. 3. Findings suggesting third-spacing of fluid including mild anasarca, moderate to large volume ascites and moderate left and small right dependent pleural effusions. 4.  Aortic Atherosclerosis (ICD10-I70.0). Electronically Signed: By: Ilona Sorrel M.D. On: 05/02/2019 18:12   US Paracentesis  Result Date: 05/28/2019 INDICATION: Patient with history of metastatic adenocarcinoma of unknown primary, left lower extremity DVT, recurrent ascites; request received for diagnostic and therapeutic paracentesis. EXAM: ULTRASOUND GUIDED DIAGNOSTIC AND THERAPEUTIC PARACENTESIS MEDICATIONS: None COMPLICATIONS: None immediate. PROCEDURE: Informed written consent was obtained from the patient after a discussion of the risks, benefits and alternatives to treatment. A timeout was performed prior to the initiation of the procedure. Initial ultrasound scanning demonstrates a large amount of ascites within the right mid to lower abdominal quadrant. The right mid to lower abdomen was prepped and draped in the usual sterile fashion. 1% lidocaine was used for local anesthesia. Following this, a 19 gauge, 10-cm, Yueh catheter was introduced. An ultrasound  image was saved for documentation purposes. The paracentesis was performed. The catheter was removed and a dressing was applied. The patient tolerated the procedure well without immediate post procedural complication. Patient was administered 25 grams IV albumin by floor nurse on unit FINDINGS: A total of approximately 5 liters of slightly hazy, yellow fluid was removed. Samples were sent to the laboratory as requested by the clinical team. IMPRESSION: Successful ultrasound-guided diagnostic and therapeutic paracentesis yielding 5 liters of peritoneal fluid. Read by: Rowe Robert, PA-C Electronically Signed   By: Lucrezia Europe M.D.   On: 05/28/2019 14:52  US Paracentesis  Result Date: 05/22/2019 INDICATION: Patient with history of metastatic adenocarcinoma of unknown primary with ascites. Request was made for therapeutic and diagnostic paracentesis up to 5 L maximum EXAM: ULTRASOUND GUIDED THERAPEUTIC AND DIAGNOSTIC PARACENTESIS MEDICATIONS: None. COMPLICATIONS: None immediate. PROCEDURE: Informed written consent was obtained from the patient after a discussion of the risks, benefits and alternatives to treatment. A timeout was performed prior to the initiation of the procedure. Initial ultrasound scanning demonstrates a large amount of ascites within the right lower abdominal quadrant. The right lower abdomen was prepped and draped in the usual sterile fashion. 1% lidocaine was used for local anesthesia. Following this, a 19 gauge, 7-cm, Yueh catheter was introduced. An ultrasound image was saved for documentation purposes. The paracentesis was performed. The catheter was removed and a dressing was applied. The patient tolerated the procedure well without immediate post procedural complication. FINDINGS: A total of approximately 5 L of straw colored fluid was removed. Samples were sent to the laboratory as requested by the clinical team. IMPRESSION: Successful ultrasound-guided therapeutic and diagnostic  paracentesis yielding 5 liters of peritoneal fluid. Read by Rushie Nyhan NP Electronically Signed   By: Sandi Mariscal M.D.   On: 05/22/2019 11:01   US Paracentesis  Result Date: 05/05/2019 INDICATION: Patient with history of metastatic adenocarcinoma of unknown primary, ascites. Request made for diagnostic and therapeutic paracentesis up to 5 liters. EXAM: ULTRASOUND GUIDED DIAGNOSTIC AND THERAPEUTIC PARACENTESIS MEDICATIONS: None COMPLICATIONS: None immediate. PROCEDURE: Informed written consent was obtained from the patient after a discussion of the risks, benefits and alternatives to treatment. A timeout was performed prior to the initiation of the procedure. Initial ultrasound scanning demonstrates a large amount of ascites within the left mid to lower abdominal quadrant. The left mid to lower abdomen was prepped and draped in the usual sterile fashion. 1% lidocaine was used for local anesthesia. Following this, a 19 gauge, 7-cm, Yueh catheter was introduced. An ultrasound image was saved for documentation purposes. The paracentesis was performed. The catheter was removed and a dressing was applied. The patient tolerated the procedure well without immediate post procedural complication. FINDINGS: A total of approximately 5 liters of yellow fluid was removed. Samples were sent to the laboratory as requested by the clinical team. IMPRESSION: Successful ultrasound-guided diagnostic and therapeutic paracentesis yielding 5 liters of peritoneal fluid. Read by: Rowe Robert, PA-C Electronically Signed   By: Sandi Mariscal M.D.   On: 05/05/2019 16:51   Dg Chest Port 1 View  Result Date: 05/31/2019 CLINICAL DATA:  Shortness of breath. EXAM: PORTABLE CHEST 1 VIEW COMPARISON:  Radiograph yesterday. CT 05/27/2019 FINDINGS: Left chest port remains in place. Unchanged heart size and mediastinal contours. Slight worsening left basilar opacity likely combination of pleural fluid and airspace disease. Slight worsening  in streaky right infrahilar opacities. No pulmonary edema or pneumothorax. IMPRESSION: 1. Slight worsening left basilar opacity likely combination of pleural fluid and airspace disease. 2. Slight worsening streaky right infrahilar opacities, favor atelectasis. Small right pleural effusion on CT is not well seen radiographically. Electronically Signed   By: Keith Rake M.D.   On: 05/31/2019 05:23   Dg Chest Port 1 View  Result Date: 05/30/2019 CLINICAL DATA:  Fever.  Chemotherapy. EXAM: PORTABLE CHEST 1 VIEW COMPARISON:  Radiograph yesterday. CT 05/27/2019 FINDINGS: Axis left chest port remains in place. Lower lung volumes from prior exam. Increasing hazy bibasilar opacities. Increasing perivascular haziness/vascular congestion. Changed heart size and mediastinal contours. Aortic atherosclerosis and tortuosity. No pneumothorax. IMPRESSION: 1. Increasing  hazy bibasilar opacities, likely combination of pleural effusions a compressive atelectasis. Difficult to exclude superimposed pneumonia. 2. Increasing perivascular haziness/vascular congestion. 3. Aortic atherosclerosis. Electronically Signed   By: Keith Rake M.D.   On: 05/30/2019 01:18   Dg Chest Port 1 View  Result Date: 05/29/2019 CLINICAL DATA:  Shortness of breath EXAM: PORTABLE CHEST 1 VIEW COMPARISON:  Chest CT from 2 days ago FINDINGS: Normal heart size and unremarkable mediastinal contours. Left-sided port with tip in good position. Bands of opacity with haziness and costophrenic sulcus blunting greater on the left. No edema or pneumothorax. IMPRESSION: Atelectasis and small pleural effusions. Aeration has improved from 2 days ago. Electronically Signed   By: Monte Fantasia M.D.   On: 05/29/2019 05:16   Dg Chest Portable 1 View  Result Date: 05/27/2019 CLINICAL DATA:  Dizziness and syncope EXAM: PORTABLE CHEST 1 VIEW COMPARISON:  None. FINDINGS: There is mild cardiomegaly. A left-sided MediPort catheter is seen with the tip at the  lower SVC. There is moderate left and small right pleural effusion. Hazy airspace opacity seen adjacent to lower lungs which is likely layering effusion and/or compressive atelectasis. No acute osseous abnormality. IMPRESSION: Moderate left and small right pleural effusion with probable adjacent layering effusion and/or atelectasis. Electronically Signed   By: Prudencio Pair M.D.   On: 05/27/2019 01:00   Vas [place] Lower Extremity Venous (dvt)  Result Date: 05/12/2019  Lower Venous Study Indications: Swelling.  Risk Factors: Cancer. Limitations: Poor ultrasound/tissue interface and Ascites. Comparison Study: No prior studies. Performing Technologist: Oliver Hum RVT  Examination Guidelines: A complete evaluation includes B-mode imaging, spectral Doppler, color Doppler, and power Doppler as needed of all accessible portions of each vessel. Bilateral testing is considered an integral part of a complete examination. Limited examinations for reoccurring indications may be performed as noted.  +-----+---------------+---------+-----------+----------+--------------+ RIGHTCompressibilityPhasicitySpontaneityPropertiesThrombus Aging +-----+---------------+---------+-----------+----------+--------------+ CFV  Full           Yes      Yes                                 +-----+---------------+---------+-----------+----------+--------------+   +---------+---------------+---------+-----------+----------+--------------+ LEFT     CompressibilityPhasicitySpontaneityPropertiesThrombus Aging +---------+---------------+---------+-----------+----------+--------------+ CFV      Partial        No       No                   Acute          +---------+---------------+---------+-----------+----------+--------------+ FV Prox  None                                         Acute          +---------+---------------+---------+-----------+----------+--------------+ FV Mid   None                                          Acute          +---------+---------------+---------+-----------+----------+--------------+ FV DistalNone                                         Acute          +---------+---------------+---------+-----------+----------+--------------+ PFV  Full                                                        +---------+---------------+---------+-----------+----------+--------------+ POP      None           No       No                   Acute          +---------+---------------+---------+-----------+----------+--------------+ PTV      None                                         Acute          +---------+---------------+---------+-----------+----------+--------------+ PERO     None                                         Acute          +---------+---------------+---------+-----------+----------+--------------+ EIV      None           No       No                   Acute          +---------+---------------+---------+-----------+----------+--------------+ Unable to visualize the common iliac vein, and inferior vena cava due to ascites.    Summary: Right: There is no evidence of deep vein thrombosis in the lower extremity. Left: Findings consistent with acute deep vein thrombosis involving the left external iliac vein, common femoral vein, left femoral vein, left popliteal vein, left posterior tibial veins, and left peroneal veins. No cystic structure found in the popliteal fossa.  *See table(s) above for measurements and observations. Electronically signed by Servando Snare MD on 05/12/2019 at 5:43:03 PM.    Final     ASSESSMENT AND PLAN: 1. Metastatic adenocarcinoma unknown primary  CTs6/01/2019-large mass left hepatic lobe measuring 9.1 cm, few ill-defined lesions in the right hepatic lobe measuring up to 9 mm. Indeterminate gastrohepatic ligament lymph node measuring 1.1 cm. Shotty periportal and right pericardial adenopathy.  Biopsy liver lesion6/03/2019  -adenocarcinoma unknown primary most likely pancreatobiliary tree(including intrahepatic cholangiocarcinoma)and upper GI tract;CK7 positive; CK20, TTF-1, CDX2, GATA 3, p63 and PSA negative  Colonoscopy 12/09/2018-internal hemorrhoids, mild. Examination otherwise normal.  Upper endoscopy 12/09/2018-examined duodenum normal. Diffuse mild inflammation characterized by congestion and erythema in the gastric antrum.Biopsy showed chronic active gastritis with intestinal metaplasia; immunohistochemical staining positive for H pylori.  CT chest 12/13/2018-heterogeneous hypodense nodule left thyroid lobe. Previously reported small bilateral pleural effusions have resolved. Trace pericardial effusion. Interval development of several small lymph nodes in the right anterior cardiophrenic space measuring up to 1.2 x 0.8 cm. Also mildly prominent lymph nodes in the periceliac region, gastrohepatic ligament and periportal area.  12/13/2018 CEA 8.2   12/28/2018 PET scan-multiple intensely FDG avid hepatic metastases. FDG avid anterior cardiophrenic, periportal, periceliac and gastrohepatic ligament lymph nodes. New trace right pleural effusion. Stable trace pericardial effusion.  Cycle 1 FOLFOX 01/10/2019   Cycle 2 FOLFOX 01/24/2019   01/24/2019 CEA 13.3, CA 19-9 163  CTs 02/01/2019-multiple confluent liver lesions  noted occupying nearly the entire left lobe. More separated lesions in the right lobe have increased in size. At least 10-12 enlarging/new lesions in the right lobe of the liver.  Cycle 3 FOLFOX 02/07/2019  03/03/2019-left hepatic Y90  03/31/2019-right hepatic Y90  CTs 05/02/2019-liver lesions have significantly increased in number; othersare stable to increased in size since 02/01/2019 CT. Enlarged gastrohepatic ligament, portacaval, left periaortic and aortocaval lymph nodes appear stable. Anasarca, ascites and pleural effusions are essentially new.  Guardant 360 testing 05/12/2019-  PIK3CA, IDH2 alterations, MSI high not detected, tumor mutation burden 8.57, ATM Vus  Cycle 1 gemcitabine/Abraxane 05/12/2019  Cycle 2 gemcitabine/Abraxane 05/26/2019 2. BPH 3. Hearing loss 4. Neuropathy 5. History of H. pylori status post treatment 6. History of atrial fibrillation 7. Asymmetric leg edema-referred for Doppler 8. Ascites-paracentesis 05/05/2019 with 5 L removed, cytology with reactive mesothelial cells, paracentesis 05/28/2019 with 5 L removed, cytology pending. 9. Left lower extremity DVT confirmed on Doppler 05/12/2019-apixaban started 10. Hospital admission 05/27/2019 - syncope  Darius Ellis continues to have poor appetite and remains weak overall.  He has increased abdominal distention.  He remains afebrile.  The patient has been seen by palliative care and right now the plan is for him to go home with home health, however, the patient's daughter expresses concern about being able to get him to appointments due to his continued decline in performance status and weakness.  She plans to talk to him more today about possibly going home with hospice.  He has developed mild thrombocytopenia and leukopenia due to his recent chemotherapy.  Recommendations: 1.  I have ordered an ultrasound-guided paracentesis for today.  We will repeat cytology.  The cytology from his initial paracentesis performed on 05/28/2019 is still pending. 2.  Continue ongoing discussions of goals of care.  Appreciate input from the palliative care team.  The patient's daughter plans to talk more with her father today regarding going home with hospice.  Medical oncology would be in agreement with this plan. 3.  The patient may discharge home from our perspective when otherwise medically stable and DME has been obtained.  If the patient opts to pursue additional chemotherapy, we will see him for follow-up as previously scheduled on 06/09/2019.   LOS: 4 days   Mikey Bussing, DNP, AGPCNP-BC,  AOCNP 05/31/19  Darius Ellis was interviewed and examined.  I was contacted by the palliative care team and alerted that the patient and his daughter had now decided on hospice care.  I agree with the plan for home hospice care.  I will be glad to serve as the primary physician with the hospice team.

## 2019-06-01 DIAGNOSIS — D61810 Antineoplastic chemotherapy induced pancytopenia: Secondary | ICD-10-CM

## 2019-06-01 DIAGNOSIS — Z7901 Long term (current) use of anticoagulants: Secondary | ICD-10-CM

## 2019-06-01 DIAGNOSIS — R338 Other retention of urine: Secondary | ICD-10-CM

## 2019-06-01 LAB — COMPREHENSIVE METABOLIC PANEL
ALT: 27 U/L (ref 0–44)
AST: 47 U/L — ABNORMAL HIGH (ref 15–41)
Albumin: 2.6 g/dL — ABNORMAL LOW (ref 3.5–5.0)
Alkaline Phosphatase: 217 U/L — ABNORMAL HIGH (ref 38–126)
Anion gap: 11 (ref 5–15)
BUN: 41 mg/dL — ABNORMAL HIGH (ref 8–23)
CO2: 22 mmol/L (ref 22–32)
Calcium: 9.2 mg/dL (ref 8.9–10.3)
Chloride: 102 mmol/L (ref 98–111)
Creatinine, Ser: 1.19 mg/dL (ref 0.61–1.24)
GFR calc Af Amer: >60 mL/min (ref >60)
GFR calc non Af Amer: 58 mL/min — ABNORMAL LOW (ref >60)
Glucose, Bld: 116 mg/dL — ABNORMAL HIGH (ref 70–99)
Potassium: 3.7 mmol/L (ref 3.5–5.1)
Sodium: 135 mmol/L (ref 135–145)
Total Bilirubin: 1.6 mg/dL — ABNORMAL HIGH (ref 0.3–1.2)
Total Protein: 4.9 g/dL — ABNORMAL LOW (ref 6.5–8.1)

## 2019-06-01 LAB — CBC WITH DIFFERENTIAL/PLATELET
Abs Immature Granulocytes: 0.15 10*3/uL — ABNORMAL HIGH (ref 0.00–0.07)
Basophils Absolute: 0 10*3/uL (ref 0.0–0.1)
Basophils Relative: 2 %
Eosinophils Absolute: 0 10*3/uL (ref 0.0–0.5)
Eosinophils Relative: 0 %
HCT: 32 % — ABNORMAL LOW (ref 39.0–52.0)
Hemoglobin: 10.3 g/dL — ABNORMAL LOW (ref 13.0–17.0)
Immature Granulocytes: 8 %
Lymphocytes Relative: 7 %
Lymphs Abs: 0.1 10*3/uL — ABNORMAL LOW (ref 0.7–4.0)
MCH: 29.4 pg (ref 26.0–34.0)
MCHC: 32.2 g/dL (ref 30.0–36.0)
MCV: 91.4 fL (ref 80.0–100.0)
Monocytes Absolute: 0.1 10*3/uL (ref 0.1–1.0)
Monocytes Relative: 4 %
Neutro Abs: 1.6 10*3/uL — ABNORMAL LOW (ref 1.7–7.7)
Neutrophils Relative %: 79 %
Platelets: 49 10*3/uL — ABNORMAL LOW (ref 150–400)
RBC: 3.5 MIL/uL — ABNORMAL LOW (ref 4.22–5.81)
RDW: 17.3 % — ABNORMAL HIGH (ref 11.5–15.5)
WBC: 1.9 10*3/uL — ABNORMAL LOW (ref 4.0–10.5)
nRBC: 0 % (ref 0.0–0.2)

## 2019-06-01 LAB — URINE CULTURE: Culture: 10000 — AB

## 2019-06-01 LAB — GUARDANT 360

## 2019-06-01 LAB — LIPASE, FLUID: Lipase-Fluid: 24 U/L

## 2019-06-01 LAB — CYTOLOGY - NON PAP

## 2019-06-01 MED ORDER — ALBUMIN HUMAN 25 % IV SOLN
25.0000 g | Freq: Once | INTRAVENOUS | Status: AC
  Administered 2019-06-01: 25 g via INTRAVENOUS
  Filled 2019-06-01: qty 100

## 2019-06-01 NOTE — TOC Progression Note (Signed)
Transition of Care [facility]) - Progression Note    Patient Details  Name: Darius Ellis MRN: [id number] Date of Birth: 24-Oct-1940  Transition of Care [facility]) CM/SW Contact  Eyden Dobie, Juliann Pulse, RN Phone Number: 06/01/2019, 11:21 AM  Clinical Narrative: family in agreement to home w/hospice authora care rep [facility] following-DNR to be sent home. PTAR for transport.      Expected Discharge Plan: Home w Hospice Care Barriers to Discharge: Continued Medical Work up  Expected Discharge Plan and Services Expected Discharge Plan: [place]   Discharge Planning Services: CM Consult Post Acute Care Choice: Hospice Living arrangements for the past 2 months: [place]: RN [place] Agency: Hospice and [place] Date [place]: 06/01/19 Time [place]: 1121 Representative spoke with at [place]: [place] ([place]) Interventions    Readmission Risk Interventions Readmission Risk Prevention Plan 05/29/2019  Transportation Screening Complete  PCP or Specialist Appt within 3-5 Days Not Complete  HRI or [place] Complete  Social Work Consult for [place] Planning/Counseling Complete  Palliative Care Screening Complete  Medication Review [employer]) Complete

## 2019-06-01 NOTE — Progress Notes (Addendum)
Hydrologist [facility])  Hospital Liaison: RN note   Hospice eligibility confirmed. DME requested, 3N1, Hospital Bed and Over bed table have been ordered.   Please leave catheter in per family request and [facility] will provide catheter care for pt. Send pt home with DNR and please call with any questions. [facility] will work to provide admission visit as soon as pt discharges home if possible.   [facility] aware of the above. Please notify [facility] when patient is ready to leave the unit at discharge. Call [phone] or [phone] after 5pm.     Please call with any hospice related questions.   Farrel Gordon, RN, CCM [facility] Liaison (listed on AMION under Hospice and [place] [facility] [place])  [phone]

## 2019-06-01 NOTE — Progress Notes (Signed)
PROGRESS NOTE    Darius Ellis  GEX:[id number] DOB: 09-24-1940 DOA: 05/27/2019 PCP: Libby Maw, MD    Brief Narrative:  On 05/27/2019 Patient presented early that morning after midnight via EMS complaining of dizziness, syncope and generalized weakness. He stated that he had chemotherapy yesterday morning for liver cancer.He was admitted after midnight by my partner and colleague Dr. Florene Glen I am in current agreement with this assessment and plan. The patient fell early this AM. Any headache, nausea, vomiting. Denies any chest pain or abdominal pain but did have some diarrhea.It is unclear if the patient passed out or not but when his daughter found him on the floor he was awake and his daughter did not see any convulsive activity and she believes he did not lose consciousness.  Hewasbrought to the ED via EMS and initial evaluation showed troponins that wereelevated at 166but nowstarted trending down. Cardiologywasconsulted and felt that chemotherapy may cause cardiotoxicity. Patient is already anticoagulated with Eliquis for his atrial fibrillation and for a Left Leg DVT.In the ED he was given 2 500 mL boluses of normal saline and had further work-up and imaging done with a CT head without contrast and a CT angio chest PE.  Patient's abdomen was more distended but he felt less short of breath slightly and underwent a repeat paracentesis  05/28/2019 and drained 5 L.  Had some acute urine retention Flomax was restarted and currently undergoing in and out catheterizations.  PT and OT recommending SNF.  Palliative care-met with family.  Initially family wanted to pursue aggressive measures and discuss hospice at a later point.  Patient however with no significant improvement and deteriorating.  Palliative care reassessing and likely home with hospice.      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 coli UTI   Fever  1 generalized weakness with recurrent falls/??  Syncopal episode Questionable etiology.  Cardiac enzymes were cycled which were elevated and however subsequently trended down.  There was some concern for orthostasis.  Patient received some fluid boluses.  SARS-CoV-2 which was done was negative.  Urinalysis unremarkable.  Head CT was negative for any acute abnormalities.  CT angiogram chest was negative for PE however did show small bilateral pleural effusions left greater than right with probable adjacent compressive atelectasis.  2D echo which was done showed a normal left ventricular right ventricular systolic function and size with right atrial pressure estimated to be less than 3 mmHg.  Patient was seen by cardiology and was felt patient's falls likely secondary to orthostasis and recommended incentive spirometry with continued anticoagulation and no further recommendations.  Patient underwent paracentesis with 5 L removed and still with some abdominal distention and underwent repeat ultrasound-guided paracentesis reordered per oncology on 05/31/2019 with 3.7 L of fluid removed..  Palliative care following as patient with a failure to thrive with no significant improvement.  Patient likely home with hospice when medically stable.  2.  Dyspnea Patient noted to have progressive worsening shortness of breath which  initially improved post paracentesis with 5 L removed on 05/22/2019.  2D echo which was done with normal EF with no wall motion abnormalities.  Patient initially given some IV Lasix  which was initially held due to borderline blood pressure.  Patient also received a dose of IV albumin.  Patient with some abdominal distention and as such repeat ultrasound-guided paracentesis ordered per oncology on 05/31/2019 with 3.7 L of fluid removed..  Cardiology was following however has signed off.  Supportive care.  3.  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  Therapeutic ultrasound-guided paracentesis was done with 5 L removed.  Patient with some abdominal distention again and underwent repeat ultrasound-guided paracentesis on 05/31/2019 with 3.7 L of slightly hazy, yellow fluid removed and sample sent for cytology as ordered per oncology.  Patient status post IV albumin every 6 hours x1 day. Patient with no significant improvement and seems to be deteriorating.  Palliative care following and family leaning towards home with hospice.  4.  BPH/acute urinary retention Continue finasteride and tamsulosin. I and O cath prn. Follow.  5.  Metastatic adenocarcinoma with unknown primary Patient noted to have undergone chemotherapy and gemcitabine/Abraxane on 05/26/2019.  Patient with no significant improvement.  Oncology following.  Palliative care following.  Family and patient seem to be leaning towards home with hospice.   6.  Pancytopenia Likely secondary to recent chemotherapy.  Patient with no overt bleeding.  Counts trending down.  Discussed with oncology who recommended monitoring counts to make sure there is stabilized prior to discharge home with hospice.  Oncology following and appreciate input and recommendations.   7.  Left lower extremity DVT Per Dopplers of 05/12/2019.  Patient with a platelet count of 49,000.  Case was discussed with oncology who recommended discontinuation of Eliquis.  Follow.  8.  Fever/E. coli UTI Likely tumor fever versus UTI.  Patient  pancultured.  Urinalysis nitrite positive, trace leukocytes, 11-20 WBCs.  Ultrasound-guided paracentesis which was done negative for SBP.  Chest x-ray negative for any acute infiltrate and patient without a cough or significant respiratory symptoms.  Body fluid cultures negative.  Blood cultures negative to date.  Urine cultures with 10,000 colonies of E. Coli, sensitive to the cephalosporins and quinolones as well as Bactrim, Macrobid.  Continue IV Rocephin and transition to oral Levaquin on discharge for 7 additional days post discharge.  9.  Hyponatremia Improved.  Follow.  10.  Goals of care/DNR, POA Patient seen by palliative care and oncology.  Patient not improving significantly clinically and seems to be deteriorating.  Palliative care following and family will likely go home with hospice when medically stable.  Palliative care following and I appreciate the input and recommendations.      DVT prophylaxis: Thrombocytopenia/platelet count of 49,000. Code Status: DNR Family Communication: Updated patient.  Updated daughter at bedside. Disposition Plan: Likely home with hospice.   Consultants:   Palliative care: Jobe Gibbon 05/29/2019  Cardiology: Dr. Caryl Comes 05/27/2019  Oncology: Dr. Benay Spice  Procedures:   CT angiogram chest 05/27/2019  CT head 05/27/2019  Chest x-ray 05/27/2019, 05/29/2019, 05/30/2019, 05/31/2019  Ultrasound-guided paracentesis 05/28/2019--- 5 L of slightly hazy, yellow fluid removed.  Ultrasound-guided paracentesis 05/31/2019 pending  2D echo 05/27/2019  Antimicrobials:   IV Rocephin 05/30/2019     Subjective: Patient lying in bed.  Denies any chest pain.  Somewhat frail.  States not feeling too well.  Denies any bleeding.  Daughter at bedside.   Objective: Vitals:   05/31/19 2102 06/01/19 0509 06/01/19 0700 06/01/19 0741  BP: 105/61 (!) 106/53    Pulse: (!) 102 (!) 102    Resp:  20    Temp:  98.5 F (36.9 C)    TempSrc:      SpO2:   92% 92% 93%  Weight:      Height:        Intake/Output Summary (Last 24 hours) at 06/01/2019 1136 Last data filed at 06/01/2019 0827 Gross per 24 hour  Intake 310 ml  Output 450 ml  Net -140 ml   Filed Weights   05/27/19 0035  Weight: 82.9 kg    Examination:  General exam: Frail.  Pallor. Respiratory system: Lungs are to auscultation bilaterally anterior lung fields.  Normal respiratory effort.  Cardiovascular system: Regular rate and rhythm no murmurs rubs or gallops.  No JVD.  2-3+ bilateral lower extremity edema left greater than right. Gastrointestinal system: Abdomen is mildly distended, soft, decreased tenderness to palpation, positive bowel sounds.  No rebound.  No guarding.  Central nervous system: Alert and oriented. No focal neurological deficits. Extremities: Symmetric 5 x 5 power. Skin: No rashes, lesions or ulcers Psychiatry: Judgement and insight appear normal. Mood & affect appropriate.     Data Reviewed: I have personally reviewed following labs and imaging studies  CBC: Recent Labs  Lab 05/28/19 0537 05/29/19 0500 05/30/19 0105 05/31/19 0403 06/01/19 0833  WBC 7.7 8.2 6.5 2.6* 1.9*  NEUTROABS 7.0 7.5 6.0 2.2 1.6*  HGB 12.6* 11.9* 12.0* 11.9* 10.3*  HCT 40.1 37.0* 37.7* 37.4* 32.0*  MCV 93.5 90.7 92.0 90.3 91.4  PLT 168 138* 100* 96* 49*   Basic Metabolic Panel: Recent Labs  Lab 05/28/19 0537 05/29/19 0500 05/30/19 0105 05/31/19 0403 06/01/19 0833  NA 137 136 131* 134* 135  K 3.6 3.5 3.7 3.9 3.7  CL 104 105 102 104 102  CO2 23 21* 20* 21* 22  GLUCOSE 112* 101* 124* 109* 116*  BUN 37* 31* 32* 39* 41*  CREATININE 1.41* 0.98 1.07 1.17 1.19  CALCIUM 8.4* 8.4* 8.0* 8.5* 9.2  MG 2.3 2.1  --  2.0  --   PHOS 3.2 2.6 2.7 3.3  --    GFR: Estimated Creatinine Clearance: 56.9 mL/min (by C-G formula based on SCr of 1.19 mg/dL). Liver Function Tests: Recent Labs  Lab 05/28/19 0537 05/29/19 0500 05/30/19 0105 05/31/19 0403 06/01/19 0833   AST 40 50* 59* 50* 47*  ALT 31 32 36 32 27  ALKPHOS 236* 272* 244* 236* 217*  BILITOT 1.5* 2.1* 2.0* 1.7* 1.6*  PROT 4.8* 4.6* 4.5* 4.8* 4.9*  ALBUMIN 2.3* 2.3* 2.2* 2.2* 2.6*   No results for input(s): LIPASE, AMYLASE in the last 168 hours. Recent Labs  Lab 05/31/19 1130  AMMONIA 28   Coagulation Profile: Recent Labs  Lab 05/28/19 0537  INR 1.7*   Cardiac Enzymes: No results for input(s): CKTOTAL, CKMB, CKMBINDEX, TROPONINI in the last 168 hours. BNP (last 3 results) No results for input(s): PROBNP in the last 8760 hours. HbA1C: No results for input(s): HGBA1C in the last 72 hours. CBG: No results for input(s): GLUCAP in the last 168 hours. Lipid Profile: No results for input(s): CHOL, HDL, LDLCALC, TRIG, CHOLHDL, LDLDIRECT in the last 72 hours. Thyroid Function Tests: Recent Labs    05/30/19 0105  TSH 8.914*   Anemia Panel: No results for input(s): VITAMINB12, FOLATE, FERRITIN, TIBC, IRON, RETICCTPCT in the last 72  hours. Sepsis Labs: No results for input(s): PROCALCITON, LATICACIDVEN in the last 168 hours.  Recent Results (from the past 240 hour(s))  SARS CORONAVIRUS 2 (TAT 6-24 HRS) Nasopharyngeal Nasopharyngeal Swab     Status: None   Collection Time: 05/27/19  4:53 AM   Specimen: Nasopharyngeal Swab  Result Value Ref Range Status   SARS Coronavirus 2 NEGATIVE NEGATIVE Final    Comment: (NOTE) SARS-CoV-2 target nucleic acids are NOT DETECTED. The SARS-CoV-2 RNA is generally detectable in upper and lower respiratory specimens during the acute phase of infection. Negative results do not preclude SARS-CoV-2 infection, do not rule out co-infections with other pathogens, and should not be used as the sole basis for treatment or other patient management decisions. Negative results must be combined with clinical observations, patient history, and epidemiological information. The expected result is Negative. Fact Sheet for Patients:  SugarRoll.be Fact Sheet for Healthcare Providers: https://www.woods-mathews.com/ This test is not yet approved or cleared by the [place] FDA and  has been authorized for detection and/or diagnosis of SARS-CoV-2 by FDA under an Emergency Use Authorization (EUA). This EUA will remain  in effect (meaning this test can be used) for the duration of the COVID-19 declaration under Section 56 4(b)(1) of the Act, 21 U.S.C. section 360bbb-3(b)(1), unless the authorization is terminated or revoked sooner. Performed at [facility], [place] [address], [place], [place] [address]   Fungus Culture With Stain     Status: None (Preliminary result)   Collection Time: 05/28/19  1:51 PM   Specimen: PATH Cytology Peritoneal fluid  Result Value Ref Range Status   Fungus Stain Final report  Final    Comment: (NOTE) Performed At: [facility] [place], [place] [id number] Rush Farmer MD [id number]    Fungus (Mycology) Culture PENDING  Incomplete   Fungal Source CYTO  Final    Comment: PERITONEAL Performed at [facility], [place] [address] [place], [place] [address]   Culture, body fluid-bottle     Status: None (Preliminary result)   Collection Time: 05/28/19  1:51 PM   Specimen: Fluid  Result Value Ref Range Status   Specimen Description FLUID PERITONEAL  Final   Special Requests   Final    BOTTLES DRAWN AEROBIC AND ANAEROBIC Blood Culture adequate volume   Culture   Final    NO GROWTH 3 DAYS Performed at [facility], [address], [place], [place] [address]    Report Status PENDING  Incomplete  Gram stain     Status: None   Collection Time: 05/28/19  1:51 PM   Specimen: Fluid  Result Value Ref Range Status   Specimen Description FLUID PERITONEAL  Final   Special Requests NONE  Final   Gram Stain   Final    NO WBC SEEN NO ORGANISMS SEEN Performed at [facility], [address] [place], [place] [address]    Report Status 05/29/2019 FINAL  Final  Fungus Culture Result     Status: None   Collection Time: 05/28/19  1:51 PM  Result Value Ref Range Status   Result 1 Comment  Final    Comment: (NOTE) KOH/Calcofluor preparation:  no fungus observed. Performed At: [facility] [place], [place] [id number] Rush Farmer MD [id number]   Culture, blood (routine x 2)     Status: None (Preliminary result)   Collection Time: 05/30/19  1:05 AM   Specimen: BLOOD  Result Value Ref Range Status  Specimen Description   Final    BLOOD RIGHT ANTECUBITAL Performed at [place] [address] [place], [place] [address]    Special Requests   Final    BOTTLES DRAWN AEROBIC AND ANAEROBIC Blood Culture adequate volume Performed at [place] [address] [place], [place] [address]    Culture   Final    NO GROWTH 2 DAYS Performed at [place] [address] [place], [place] [address]    Report Status PENDING  Incomplete  Culture, blood (routine x 2)     Status: None (Preliminary result)   Collection Time: 05/30/19  1:05 AM   Specimen: BLOOD  Result Value Ref Range Status   Specimen Description   Final    BLOOD LEFT ANTECUBITAL Performed at [place] [address], [place], [place] [address]    Special Requests   Final    BOTTLES DRAWN AEROBIC ONLY Blood Culture adequate volume Performed at [place] [address] [place], [place] [address]    Culture   Final    NO GROWTH 2 DAYS Performed at [place] [address] [place], [place] [address]    Report Status PENDING  Incomplete  Culture, Urine     Status: Abnormal   Collection Time: 05/30/19  8:31 AM   Specimen: Urine, Clean Catch  Result Value Ref Range Status   Specimen Description   Final    URINE, CLEAN CATCH Performed at [facility], [place]  [address] [place], [place] [address]    Special Requests   Final    NONE Performed at [facility], [place] [address] [place], [place] [address]    Culture 10,000 COLONIES/mL ESCHERICHIA COLI (A)  Final   Report Status 06/01/2019 FINAL  Final   Organism ID, Bacteria ESCHERICHIA COLI (A)  Final      Susceptibility   Escherichia coli - MIC*    AMPICILLIN >=32 RESISTANT Resistant     CEFAZOLIN 16 SENSITIVE Sensitive     CEFTRIAXONE <=1 SENSITIVE Sensitive     CIPROFLOXACIN <=0.25 SENSITIVE Sensitive     GENTAMICIN <=1 SENSITIVE Sensitive     IMIPENEM <=0.25 SENSITIVE Sensitive     NITROFURANTOIN <=16 SENSITIVE Sensitive     TRIMETH/SULFA <=20 SENSITIVE Sensitive     AMPICILLIN/SULBACTAM >=32 RESISTANT Resistant     PIP/TAZO <=4 SENSITIVE Sensitive     * 10,000 COLONIES/mL ESCHERICHIA COLI         Radiology Studies: US Paracentesis  Result Date: 05/31/2019 INDICATION: Patient with history of metastatic adenocarcinoma of unknown primary, left lower extremity DVT, recurrent ascites. Request received for diagnostic and therapeutic paracentesis. EXAM: ULTRASOUND GUIDED DIAGNOSTIC AND THERAPEUTIC PARACENTESIS MEDICATIONS: None COMPLICATIONS: None immediate. PROCEDURE: Informed written consent was obtained from the patient's daughter after a discussion of the risks, benefits and alternatives to treatment. A timeout was performed prior to the initiation of the procedure. Initial ultrasound scanning demonstrates a moderate to large amount of ascites within the right mid to lower abdominal quadrant. The right mid to lower abdomen was prepped and draped in the usual sterile fashion. 1% lidocaine was used for local anesthesia. Following this, a 19 gauge, 7-cm, Yueh catheter was introduced. An ultrasound image was saved for documentation purposes. The paracentesis was performed. The catheter was removed and a dressing was applied. The patient tolerated the procedure well without  immediate post procedural complication. Patient received post-procedure intravenous albumin; see  nursing notes for details. FINDINGS: A total of approximately 3.7 liters of slightly hazy, yellow fluid was removed. Samples were sent to the laboratory as requested by the clinical team. IMPRESSION: Successful ultrasound-guided diagnostic and therapeutic paracentesis yielding 3.7 liters of peritoneal fluid. Read by: Rowe Robert, PA-C Electronically Signed   By: Markus Daft M.D.   On: 05/31/2019 17:02   DG CHEST PORT 1 VIEW  Result Date: 05/31/2019 CLINICAL DATA:  Shortness of breath. EXAM: PORTABLE CHEST 1 VIEW COMPARISON:  Radiograph yesterday. CT 05/27/2019 FINDINGS: Left chest port remains in place. Unchanged heart size and mediastinal contours. Slight worsening left basilar opacity likely combination of pleural fluid and airspace disease. Slight worsening in streaky right infrahilar opacities. No pulmonary edema or pneumothorax. IMPRESSION: 1. Slight worsening left basilar opacity likely combination of pleural fluid and airspace disease. 2. Slight worsening streaky right infrahilar opacities, favor atelectasis. Small right pleural effusion on CT is not well seen radiographically. Electronically Signed   By: Keith Rake M.D.   On: 05/31/2019 05:23        Scheduled Meds: . apixaban  5 mg Oral BID  . Chlorhexidine Gluconate Cloth  6 each Topical Daily  . feeding supplement (ENSURE ENLIVE)  237 mL Oral BID BM  . feeding supplement (PRO-STAT SUGAR FREE 64)  30 mL Oral QID  . finasteride  5 mg Oral Daily  . fluticasone  2 spray Each Nare Daily  . folic acid  1 mg Oral Daily  . furosemide  20 mg Oral BID  . gabapentin  600 mg Oral BID  . loratadine  10 mg Oral Daily  . multivitamin with minerals  1 tablet Oral Daily  . sodium chloride flush  10-40 mL Intracatheter Q12H  . tamsulosin  0.4 mg Oral Daily  . thiamine  100 mg Oral Daily  . topiramate  50 mg Oral QHS  . vitamin B-12  1,000 mcg  Oral Daily   Continuous Infusions: . albumin human 25 g (06/01/19 0519)  . albumin human    . cefTRIAXone (ROCEPHIN)  IV 2 g (06/01/19 0025)     LOS: 5 days    Time spent: 35 minutes    Irine Seal, MD [facility]  If 7PM-7AM, please contact night-coverage [email or web address] 06/01/2019, 11:36 AM

## 2019-06-01 NOTE — Progress Notes (Addendum)
HEMATOLOGY-ONCOLOGY PROGRESS NOTE  SUBJECTIVE: We were contacted by the pharmacy regarding ongoing use of Eliquis in the setting of thrombocytopenia.  Platelet count is down to 49,000 today.  This is likely related to his recent chemotherapy. The patient is resting quietly in bed. No bleeding noted. Pain controlled. Daughter at bedside. Hoping to go home with hospice today.   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 [facility])   05/12/2019 -  Chemotherapy   The patient had PACLitaxel-protein bound (ABRAXANE) chemo infusion 200 mg, 100 mg/m2 = 200 mg (100 % of original dose 100 mg/m2), Intravenous,  Once, 1 of 4 cycles Dose modification: 100 mg/m2 (original dose 100 mg/m2, Cycle 1, Reason: Provider Judgment) Administration: 200 mg (05/12/2019), 200 mg (05/26/2019) gemcitabine (GEMZAR) 1,672 mg in sodium chloride 0.9 % 250 mL chemo infusion, 800 mg/m2 = 1,672 mg (100 % of original dose 800 mg/m2), Intravenous,  Once, 1 of 4 cycles Dose modification: 800 mg/m2 (original dose 800 mg/m2, Cycle 1, Reason: Provider Judgment) Administration: 1,672 mg (05/12/2019), 1,672 mg (05/26/2019)  for chemotherapy treatment.      PHYSICAL EXAMINATION:  Vitals:   06/01/19 0700 06/01/19 0741  BP:    Pulse:    Resp:    Temp:    SpO2: 92% 93%   Filed Weights   05/27/19 0035  Weight: 182 lb 12.9 oz (82.9 kg)    Intake/Output from previous day: 12/09 0701 - 12/10 0700 In: 70 [P.O.:540] Out: 450 [Urine:450]  GENERAL:alert, no distress and comfortable OROPHARYNX: No thrush or mucositis LUNGS: Diminished breath sounds at the lung bases, no respiratory distress HEART: regular rate & rhythm, pitting edema to the bilateral lower extremities, left greater than right ABDOMEN: Positive bowel sounds, increased abdominal distention this morning, liver palpable in the right upper  abdomen NEURO: alert & oriented x 3 with fluent speech, no focal motor/sensory deficits  LABORATORY DATA:  I have reviewed the data as listed CMP Latest Ref Rng & Units 06/01/2019 05/31/2019 05/30/2019  Glucose 70 - 99 mg/dL 116(H) 109(H) 124(H)  BUN 8 - 23 mg/dL 41(H) 39(H) 32(H)  Creatinine 0.61 - 1.24 mg/dL 1.19 1.17 1.07  Sodium 135 - 145 mmol/L 135 134(L) 131(L)  Potassium 3.5 - 5.1 mmol/L 3.7 3.9 3.7  Chloride 98 - 111 mmol/L 102 104 102  CO2 22 - 32 mmol/L 22 21(L) 20(L)  Calcium 8.9 - 10.3 mg/dL 9.2 8.5(L) 8.0(L)  Total Protein 6.5 - 8.1 g/dL 4.9(L) 4.8(L) 4.5(L)  Total Bilirubin 0.3 - 1.2 mg/dL 1.6(H) 1.7(H) 2.0(H)  Alkaline Phos 38 - 126 U/L 217(H) 236(H) 244(H)  AST 15 - 41 U/L 47(H) 50(H) 59(H)  ALT 0 - 44 U/L 27 32 36    Lab Results  Component Value Date   WBC 1.9 (L) 06/01/2019   HGB 10.3 (L) 06/01/2019   HCT 32.0 (L) 06/01/2019   MCV 91.4 06/01/2019   PLT 49 (L) 06/01/2019   NEUTROABS 1.6 (L) 06/01/2019    CT Head Wo Contrast  Result Date: 05/27/2019 CLINICAL DATA:  Lightheadedness.  Syncope. EXAM: CT HEAD WITHOUT CONTRAST TECHNIQUE: Contiguous axial images were obtained from the base of the skull through the vertex without intravenous contrast. COMPARISON:  April 10, 2019 FINDINGS: Brain: No subdural, epidural, or subarachnoid hemorrhage. Cerebellum, brainstem, and basal sys cisterns are normal. 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 No acute cortical ischemia infarct noted. No  mass effect or midline shift. Ventricles and sulci are stable. Vascular: No hyperdense vessel or unexpected calcification. Skull: Normal. Negative for fracture or focal lesion. Sinuses/Orbits: No acute finding. Other: None. IMPRESSION: No acute intracranial abnormalities. Electronically Signed   By: Dorise Bullion III M.D   On: 05/27/2019 03:17   CT CHEST W CONTRAST  Addendum Date: 05/12/2019   ADDENDUM REPORT: 05/12/2019 10:36 ADDENDUM: Left-sided Port-A-Cath appears to  terminate in the low SVC. It is a small bore catheter and is somewhat obscured by the contrast bolus, however. If further evaluation is desired, PA and lateral views of the chest are recommended. Electronically Signed   By: Lorin Picket M.D.   On: 05/12/2019 10:36   Addendum Date: 05/04/2019   ADDENDUM REPORT: 05/04/2019 12:09 ADDENDUM: Outside CT abdomen/pelvis dated 02/01/2019 submitted for comparison. The liver lesions have significantly increased in number and are stable to increased in size since 02/01/2019 CT, compatible with progression of metastatic disease to the liver. Representative central right liver 7.1 x 3.8 cm mass (series 2/image 65), increased from 4.0 x 2.6 cm. Representative segment 4A left liver lobe 6.9 x 6.3 cm mass (series 2/image 56), previously 6.9 x 6.4 cm, stable. Representative segment 2 left liver lobe 3.5 x 2.8 cm mass (series 2/image 52), previously 3.6 x 2.4 cm, stable. Representative new right liver dome 2.0 x 2.0 cm mass (series 2/image 46). Representative inferior right liver lobe 3.8 x 3.1 cm mass (series 2/image 64), increased from 2.1 x 2.0 cm. The enlarged gastrohepatic ligament, portacaval, left para-aortic and aortocaval lymph nodes appear stable. The anasarca, ascites and pleural effusions are essentially new. Electronically Signed   By: Ilona Sorrel M.D.   On: 05/04/2019 12:09   Result Date: 05/12/2019 CLINICAL DATA:  Metastatic adenocarcinoma of unknown primary, reportedly most likely pancreatic or biliary or upper GI origin per 11/30/2018 liver mass biopsy. Status post chemotherapy, left liver lobe radioembolization 03/03/2019 and right liver lobe radioembolization 03/31/2019. Restaging. [place]. EXAM: CT CHEST, ABDOMEN, AND PELVIS WITH CONTRAST TECHNIQUE: Multidetector CT imaging of the chest, abdomen and pelvis was performed following the standard protocol during bolus administration of intravenous contrast. CONTRAST:   173m OMNIPAQUE IOHEXOL 300 MG/ML  SOLN COMPARISON:  None available at the time of this interpretation. FINDINGS: CT CHEST FINDINGS Cardiovascular: Normal heart size. No significant pericardial effusion/thickening. Three-vessel coronary atherosclerosis. Atherosclerotic nonaneurysmal thoracic aorta. Normal caliber pulmonary arteries. No central pulmonary emboli. Mediastinum/Nodes: Hypodense 1.4 cm left thyroid nodule. Unremarkable esophagus. No pathologically enlarged axillary, mediastinal or hilar lymph nodes. Lungs/Pleura: No pneumothorax. Small dependent right pleural effusion. Moderate dependent left pleural effusion. Moderate passive dependent left lower lobe atelectasis. Mild passive dependent right lower lobe and lingular atelectasis. No acute consolidative airspace disease, lung masses or significant pulmonary nodules. Musculoskeletal: Mild thoracic spondylosis. Small sclerotic foci in the lateral right ninth through twelfth ribs, with the appearance of nearly healed fractures. CT ABDOMEN PELVIS FINDINGS Hepatobiliary: There are numerous (greater than 20) similar peripherally enhancing liver masses scattered throughout the liver. Representative 7.1 x 3.8 cm central right liver mass (series 2/image 65). Representative 6.9 x 6.3 cm segment 4A left liver lobe mass (series 2/image 56). Representative 3.4 x 2.7 cm segment 2 left liver lobe mass (series 2/image 53). Cholecystectomy. No biliary ductal dilatation. Pancreas: Normal, with no mass or duct dilation. Spleen: Normal size. No mass. Adrenals/Urinary Tract: Normal adrenals. Subcentimeter hypodense right renal cortical lesions are too small to characterize. Otherwise normal kidneys, with no hydronephrosis.  Nondistended bladder obscured by streak artifact from left hip hardware. Stomach/Bowel: Normal non-distended stomach. Normal caliber small bowel with no small bowel wall thickening. Appendix not discretely visualized. Oral contrast transits to the colon.  Normal large bowel with no diverticulosis, large bowel wall thickening or pericolonic fat stranding. Vascular/Lymphatic: Atherosclerotic nonaneurysmal abdominal aorta. Patent portal, splenic, hepatic and renal veins. Enlarged gastrohepatic ligament nodes up to 1.2 cm (series 2/image 61). Enlarged 1.5 cm portacaval node (series 2/image 69). Enlarged 1.2 cm left para-aortic node (series 2/ image 72). Enlarged 1.2 cm aortocaval node (series 2/image 76). No pelvic adenopathy. Reproductive: Top-normal size prostate, obscured by streak artifact. Other: Moderate to large volume ascites. No focal fluid collection. Ascitic fluid trapped within small right inguinal hernia sac. Mild anasarca. Musculoskeletal: No aggressive appearing focal osseous lesions. Partially visualized left total hip arthroplasty and surgical fixation hardware in the proximal right femur. Marked lumbar spondylosis. IMPRESSION: 1. Numerous peripherally enhancing liver masses throughout the liver, compatible with viable liver metastases. If the outside prior studies are submitted for comparison, an addendum can be issued at that time. 2. Mildly enlarged lymph nodes throughout the gastrohepatic ligament, portacaval, aortocaval and left para-aortic change suspicious for metastatic disease. 3. Findings suggesting third-spacing of fluid including mild anasarca, moderate to large volume ascites and moderate left and small right dependent pleural effusions. 4.  Aortic Atherosclerosis (ICD10-I70.0). Electronically Signed: By: Ilona Sorrel M.D. On: 05/02/2019 18:12   CT Angio Chest PE W and/or Wo Contrast  Result Date: 05/27/2019 CLINICAL DATA:  Dizziness and syncope EXAM: CT ANGIOGRAPHY CHEST WITH CONTRAST TECHNIQUE: Multidetector CT imaging of the chest was performed using the standard protocol during bolus administration of intravenous contrast. Multiplanar CT image reconstructions and MIPs were obtained to evaluate the vascular anatomy. CONTRAST:  127m  OMNIPAQUE IOHEXOL 350 MG/ML SOLN COMPARISON:  May 02, 2019 FINDINGS: Cardiovascular: There is a optimal opacification of the pulmonary arteries. There is no central,segmental, or subsegmental filling defects within the pulmonary arteries. The heart is normal in size. No pericardial effusion or thickening. No evidence right heart strain. There is normal three-vessel brachiocephalic anatomy without proximal stenosis. Scattered aortic atherosclerotic calcifications are seen without aneurysmal dilatation. Mediastinum/Nodes: No hilar, mediastinal, or axillary adenopathy. Again noted is a 1.4 cm low-density lesion within the left thyroid lobe. Lungs/Pleura: There are small bilateral pleural effusions, left greater right. Patchy/streaky airspace opacity seen at both lung bases which could be compressive atelectasis. Upper Abdomen: There is partially visualized multiple hypodense lesions seen throughout the liver. A moderate amount of abdominal ascites is seen. For Musculoskeletal: No chest wall abnormality. No acute or significant osseous findings. Anterior flowing osteophytes seen throughout the thoracic spine. Review of the MIP images confirms the above findings. IMPRESSION: 1. no central, segmental, or subsegmental pulmonary embolism. 2. Small bilateral pleural effusions, left greater than right with probable adjacent compressive atelectasis. 3. Partially visualized multiple hypodense liver lesions. 4. Moderate abdominal ascites. Electronically Signed   By: BPrudencio PairM.D.   On: 05/27/2019 03:40   UKoreaAbdomen Complete  Result Date: 05/27/2019 CLINICAL DATA:  Acute renal failure, ascites metastatic disease to the liver. EXAM: ABDOMEN ULTRASOUND COMPLETE COMPARISON:  05/27/2019 and 05/02/2019 FINDINGS: Gallbladder: Gallbladder is contracted with wall thickening in the setting of generalized edema. No sonographic Murphy's sign reported by the sonographer. Common bile duct: Diameter: 3 mm Liver: Diffuse  heterogeneity of the liver with multiple areas of hypo coexisting metastatic disease. These are better displayed on recent CT evaluations. Portal vein is patent on  color Doppler imaging with normal direction of blood flow towards the liver. IVC: No abnormality visualized. Pancreas: Visualized portion unremarkable. Spleen: Size and appearance within normal limits. Right Kidney: Length: 9.5 cm. Cortical thinning. No signs of hydronephrosis. Renal sinus lipomatosis. Hypoechoic renal lesions likely small proteinaceous or hemorrhagic cysts unchanged from recent CT of the chest, abdomen and pelvis. Left Kidney: Length: 11.5 cm. Limited assessment of the left kidney. Visualized portions are normal. No visible hydronephrosis. Abdominal aorta: Distal portion of the aorta at the bifurcation is not well visualized. No signs of aneurysm and visible portions. Other findings: Ascites present in all 4 quadrants. Signs of bowel edema, generalized likely related to underlying anasarca. IMPRESSION: 1. Multiple metastatic lesions in the liver. 2. Signs of anasarca with ascites and generalized edema. 3. No signs of biliary ductal dilation. Electronically Signed   By: Zetta Bills M.D.   On: 05/27/2019 11:09   CT ABDOMEN PELVIS W CONTRAST  Addendum Date: 05/12/2019   ADDENDUM REPORT: 05/12/2019 10:36 ADDENDUM: Left-sided Port-A-Cath appears to terminate in the low SVC. It is a small bore catheter and is somewhat obscured by the contrast bolus, however. If further evaluation is desired, PA and lateral views of the chest are recommended. Electronically Signed   By: Lorin Picket M.D.   On: 05/12/2019 10:36   Addendum Date: 05/04/2019   ADDENDUM REPORT: 05/04/2019 12:09 ADDENDUM: Outside CT abdomen/pelvis dated 02/01/2019 submitted for comparison. The liver lesions have significantly increased in number and are stable to increased in size since 02/01/2019 CT, compatible with progression of metastatic disease to the liver.  Representative central right liver 7.1 x 3.8 cm mass (series 2/image 65), increased from 4.0 x 2.6 cm. Representative segment 4A left liver lobe 6.9 x 6.3 cm mass (series 2/image 56), previously 6.9 x 6.4 cm, stable. Representative segment 2 left liver lobe 3.5 x 2.8 cm mass (series 2/image 52), previously 3.6 x 2.4 cm, stable. Representative new right liver dome 2.0 x 2.0 cm mass (series 2/image 46). Representative inferior right liver lobe 3.8 x 3.1 cm mass (series 2/image 64), increased from 2.1 x 2.0 cm. The enlarged gastrohepatic ligament, portacaval, left para-aortic and aortocaval lymph nodes appear stable. The anasarca, ascites and pleural effusions are essentially new. Electronically Signed   By: Ilona Sorrel M.D.   On: 05/04/2019 12:09   Result Date: 05/12/2019 CLINICAL DATA:  Metastatic adenocarcinoma of unknown primary, reportedly most likely pancreatic or biliary or upper GI origin per 11/30/2018 liver mass biopsy. Status post chemotherapy, left liver lobe radioembolization 03/03/2019 and right liver lobe radioembolization 03/31/2019. Restaging. [place]. EXAM: CT CHEST, ABDOMEN, AND PELVIS WITH CONTRAST TECHNIQUE: Multidetector CT imaging of the chest, abdomen and pelvis was performed following the standard protocol during bolus administration of intravenous contrast. CONTRAST:  179m OMNIPAQUE IOHEXOL 300 MG/ML  SOLN COMPARISON:  None available at the time of this interpretation. FINDINGS: CT CHEST FINDINGS Cardiovascular: Normal heart size. No significant pericardial effusion/thickening. Three-vessel coronary atherosclerosis. Atherosclerotic nonaneurysmal thoracic aorta. Normal caliber pulmonary arteries. No central pulmonary emboli. Mediastinum/Nodes: Hypodense 1.4 cm left thyroid nodule. Unremarkable esophagus. No pathologically enlarged axillary, mediastinal or hilar lymph nodes. Lungs/Pleura: No pneumothorax. Small dependent right pleural effusion. Moderate  dependent left pleural effusion. Moderate passive dependent left lower lobe atelectasis. Mild passive dependent right lower lobe and lingular atelectasis. No acute consolidative airspace disease, lung masses or significant pulmonary nodules. Musculoskeletal: Mild thoracic spondylosis. Small sclerotic foci in the lateral right ninth through twelfth ribs, with  the appearance of nearly healed fractures. CT ABDOMEN PELVIS FINDINGS Hepatobiliary: There are numerous (greater than 20) similar peripherally enhancing liver masses scattered throughout the liver. Representative 7.1 x 3.8 cm central right liver mass (series 2/image 65). Representative 6.9 x 6.3 cm segment 4A left liver lobe mass (series 2/image 56). Representative 3.4 x 2.7 cm segment 2 left liver lobe mass (series 2/image 53). Cholecystectomy. No biliary ductal dilatation. Pancreas: Normal, with no mass or duct dilation. Spleen: Normal size. No mass. Adrenals/Urinary Tract: Normal adrenals. Subcentimeter hypodense right renal cortical lesions are too small to characterize. Otherwise normal kidneys, with no hydronephrosis. Nondistended bladder obscured by streak artifact from left hip hardware. Stomach/Bowel: Normal non-distended stomach. Normal caliber small bowel with no small bowel wall thickening. Appendix not discretely visualized. Oral contrast transits to the colon. Normal large bowel with no diverticulosis, large bowel wall thickening or pericolonic fat stranding. Vascular/Lymphatic: Atherosclerotic nonaneurysmal abdominal aorta. Patent portal, splenic, hepatic and renal veins. Enlarged gastrohepatic ligament nodes up to 1.2 cm (series 2/image 61). Enlarged 1.5 cm portacaval node (series 2/image 69). Enlarged 1.2 cm left para-aortic node (series 2/ image 72). Enlarged 1.2 cm aortocaval node (series 2/image 76). No pelvic adenopathy. Reproductive: Top-normal size prostate, obscured by streak artifact. Other: Moderate to large volume ascites. No focal  fluid collection. Ascitic fluid trapped within small right inguinal hernia sac. Mild anasarca. Musculoskeletal: No aggressive appearing focal osseous lesions. Partially visualized left total hip arthroplasty and surgical fixation hardware in the proximal right femur. Marked lumbar spondylosis. IMPRESSION: 1. Numerous peripherally enhancing liver masses throughout the liver, compatible with viable liver metastases. If the outside prior studies are submitted for comparison, an addendum can be issued at that time. 2. Mildly enlarged lymph nodes throughout the gastrohepatic ligament, portacaval, aortocaval and left para-aortic change suspicious for metastatic disease. 3. Findings suggesting third-spacing of fluid including mild anasarca, moderate to large volume ascites and moderate left and small right dependent pleural effusions. 4.  Aortic Atherosclerosis (ICD10-I70.0). Electronically Signed: By: Ilona Sorrel M.D. On: 05/02/2019 18:12   US Paracentesis  Result Date: 05/31/2019 INDICATION: Patient with history of metastatic adenocarcinoma of unknown primary, left lower extremity DVT, recurrent ascites. Request received for diagnostic and therapeutic paracentesis. EXAM: ULTRASOUND GUIDED DIAGNOSTIC AND THERAPEUTIC PARACENTESIS MEDICATIONS: None COMPLICATIONS: None immediate. PROCEDURE: Informed written consent was obtained from the patient's daughter after a discussion of the risks, benefits and alternatives to treatment. A timeout was performed prior to the initiation of the procedure. Initial ultrasound scanning demonstrates a moderate to large amount of ascites within the right mid to lower abdominal quadrant. The right mid to lower abdomen was prepped and draped in the usual sterile fashion. 1% lidocaine was used for local anesthesia. Following this, a 19 gauge, 7-cm, Yueh catheter was introduced. An ultrasound image was saved for documentation purposes. The paracentesis was performed. The catheter was removed  and a dressing was applied. The patient tolerated the procedure well without immediate post procedural complication. Patient received post-procedure intravenous albumin; see nursing notes for details. FINDINGS: A total of approximately 3.7 liters of slightly hazy, yellow fluid was removed. Samples were sent to the laboratory as requested by the clinical team. IMPRESSION: Successful ultrasound-guided diagnostic and therapeutic paracentesis yielding 3.7 liters of peritoneal fluid. Read by: Rowe Robert, PA-C Electronically Signed   By: Markus Daft M.D.   On: 05/31/2019 17:02   US Paracentesis  Result Date: 05/28/2019 INDICATION: Patient with history of metastatic adenocarcinoma of unknown primary, left lower extremity DVT, recurrent ascites;  request received for diagnostic and therapeutic paracentesis. EXAM: ULTRASOUND GUIDED DIAGNOSTIC AND THERAPEUTIC PARACENTESIS MEDICATIONS: None COMPLICATIONS: None immediate. PROCEDURE: Informed written consent was obtained from the patient after a discussion of the risks, benefits and alternatives to treatment. A timeout was performed prior to the initiation of the procedure. Initial ultrasound scanning demonstrates a large amount of ascites within the right mid to lower abdominal quadrant. The right mid to lower abdomen was prepped and draped in the usual sterile fashion. 1% lidocaine was used for local anesthesia. Following this, a 19 gauge, 10-cm, Yueh catheter was introduced. An ultrasound image was saved for documentation purposes. The paracentesis was performed. The catheter was removed and a dressing was applied. The patient tolerated the procedure well without immediate post procedural complication. Patient was administered 25 grams IV albumin by floor nurse on unit FINDINGS: A total of approximately 5 liters of slightly hazy, yellow fluid was removed. Samples were sent to the laboratory as requested by the clinical team. IMPRESSION: Successful ultrasound-guided  diagnostic and therapeutic paracentesis yielding 5 liters of peritoneal fluid. Read by: Rowe Robert, PA-C Electronically Signed   By: Lucrezia Europe M.D.   On: 05/28/2019 14:52   US Paracentesis  Result Date: 05/22/2019 INDICATION: Patient with history of metastatic adenocarcinoma of unknown primary with ascites. Request was made for therapeutic and diagnostic paracentesis up to 5 L maximum EXAM: ULTRASOUND GUIDED THERAPEUTIC AND DIAGNOSTIC PARACENTESIS MEDICATIONS: None. COMPLICATIONS: None immediate. PROCEDURE: Informed written consent was obtained from the patient after a discussion of the risks, benefits and alternatives to treatment. A timeout was performed prior to the initiation of the procedure. Initial ultrasound scanning demonstrates a large amount of ascites within the right lower abdominal quadrant. The right lower abdomen was prepped and draped in the usual sterile fashion. 1% lidocaine was used for local anesthesia. Following this, a 19 gauge, 7-cm, Yueh catheter was introduced. An ultrasound image was saved for documentation purposes. The paracentesis was performed. The catheter was removed and a dressing was applied. The patient tolerated the procedure well without immediate post procedural complication. FINDINGS: A total of approximately 5 L of straw colored fluid was removed. Samples were sent to the laboratory as requested by the clinical team. IMPRESSION: Successful ultrasound-guided therapeutic and diagnostic paracentesis yielding 5 liters of peritoneal fluid. Read by Rushie Nyhan NP Electronically Signed   By: Sandi Mariscal M.D.   On: 05/22/2019 11:01   US Paracentesis  Result Date: 05/05/2019 INDICATION: Patient with history of metastatic adenocarcinoma of unknown primary, ascites. Request made for diagnostic and therapeutic paracentesis up to 5 liters. EXAM: ULTRASOUND GUIDED DIAGNOSTIC AND THERAPEUTIC PARACENTESIS MEDICATIONS: None COMPLICATIONS: None immediate. PROCEDURE:  Informed written consent was obtained from the patient after a discussion of the risks, benefits and alternatives to treatment. A timeout was performed prior to the initiation of the procedure. Initial ultrasound scanning demonstrates a large amount of ascites within the left mid to lower abdominal quadrant. The left mid to lower abdomen was prepped and draped in the usual sterile fashion. 1% lidocaine was used for local anesthesia. Following this, a 19 gauge, 7-cm, Yueh catheter was introduced. An ultrasound image was saved for documentation purposes. The paracentesis was performed. The catheter was removed and a dressing was applied. The patient tolerated the procedure well without immediate post procedural complication. FINDINGS: A total of approximately 5 liters of yellow fluid was removed. Samples were sent to the laboratory as requested by the clinical team. IMPRESSION: Successful ultrasound-guided diagnostic and therapeutic paracentesis yielding 5  liters of peritoneal fluid. Read by: Rowe Robert, PA-C Electronically Signed   By: Sandi Mariscal M.D.   On: 05/05/2019 16:51   DG CHEST PORT 1 VIEW  Result Date: 05/31/2019 CLINICAL DATA:  Shortness of breath. EXAM: PORTABLE CHEST 1 VIEW COMPARISON:  Radiograph yesterday. CT 05/27/2019 FINDINGS: Left chest port remains in place. Unchanged heart size and mediastinal contours. Slight worsening left basilar opacity likely combination of pleural fluid and airspace disease. Slight worsening in streaky right infrahilar opacities. No pulmonary edema or pneumothorax. IMPRESSION: 1. Slight worsening left basilar opacity likely combination of pleural fluid and airspace disease. 2. Slight worsening streaky right infrahilar opacities, favor atelectasis. Small right pleural effusion on CT is not well seen radiographically. Electronically Signed   By: Keith Rake M.D.   On: 05/31/2019 05:23   DG CHEST PORT 1 VIEW  Result Date: 05/30/2019 CLINICAL DATA:  Fever.   Chemotherapy. EXAM: PORTABLE CHEST 1 VIEW COMPARISON:  Radiograph yesterday. CT 05/27/2019 FINDINGS: Axis left chest port remains in place. Lower lung volumes from prior exam. Increasing hazy bibasilar opacities. Increasing perivascular haziness/vascular congestion. Changed heart size and mediastinal contours. Aortic atherosclerosis and tortuosity. No pneumothorax. IMPRESSION: 1. Increasing hazy bibasilar opacities, likely combination of pleural effusions a compressive atelectasis. Difficult to exclude superimposed pneumonia. 2. Increasing perivascular haziness/vascular congestion. 3. Aortic atherosclerosis. Electronically Signed   By: Keith Rake M.D.   On: 05/30/2019 01:18   DG CHEST PORT 1 VIEW  Result Date: 05/29/2019 CLINICAL DATA:  Shortness of breath EXAM: PORTABLE CHEST 1 VIEW COMPARISON:  Chest CT from 2 days ago FINDINGS: Normal heart size and unremarkable mediastinal contours. Left-sided port with tip in good position. Bands of opacity with haziness and costophrenic sulcus blunting greater on the left. No edema or pneumothorax. IMPRESSION: Atelectasis and small pleural effusions. Aeration has improved from 2 days ago. Electronically Signed   By: Monte Fantasia M.D.   On: 05/29/2019 05:16   DG Chest Portable 1 View  Result Date: 05/27/2019 CLINICAL DATA:  Dizziness and syncope EXAM: PORTABLE CHEST 1 VIEW COMPARISON:  None. FINDINGS: There is mild cardiomegaly. A left-sided MediPort catheter is seen with the tip at the lower SVC. There is moderate left and small right pleural effusion. Hazy airspace opacity seen adjacent to lower lungs which is likely layering effusion and/or compressive atelectasis. No acute osseous abnormality. IMPRESSION: Moderate left and small right pleural effusion with probable adjacent layering effusion and/or atelectasis. Electronically Signed   By: Prudencio Pair M.D.   On: 05/27/2019 01:00   ECHOCARDIOGRAM COMPLETE  Result Date: 05/27/2019   ECHOCARDIOGRAM REPORT    Patient Name:   Darius Ellis Date of Exam: 05/27/2019 Medical Rec #:  [id number]      Height:       73.0 in Accession #:    [id number]     Weight:       182.8 lb Date of Birth:  14-Jan-1941      BSA:          2.07 m Patient Age:    78 years       BP:           103/73 mmHg Patient Gender: M              HR:           76 bpm. Exam Location:  Inpatient Procedure: 2D Echo, Cardiac Doppler and Color Doppler Indications:    Dyspnea 786.09  History:        Patient  has no prior history of Echocardiogram examinations.                 Signs/Symptoms:Syncope; Risk Factors:Current Smoker. Elevated                 troponin.  Sonographer:    Paulita Fujita RDCS Referring Phys: [id number] [facility] LATIF [facility]  Sonographer Comments: Suboptimal parasternal window. IMPRESSIONS  1. Left ventricular ejection fraction, by visual estimation, is 60 to 65%. The left ventricle has normal function. There is no left ventricular hypertrophy.  2. Left ventricular diastolic parameters are consistent with Grade I diastolic dysfunction (impaired relaxation).  3. Global right ventricle has normal systolic function.The right ventricular size is normal. No increase in right ventricular wall thickness.  4. Left atrial size was normal.  5. Right atrial size was normal.  6. The mitral valve is normal in structure. No evidence of mitral valve regurgitation. No evidence of mitral stenosis.  7. The tricuspid valve is normal in structure. Tricuspid valve regurgitation is mild.  8. The aortic valve is normal in structure. Aortic valve regurgitation is not visualized. No evidence of aortic valve sclerosis or stenosis.  9. The pulmonic valve was not assessed. Pulmonic valve regurgitation is not visualized. 10. Normal pulmonary artery systolic pressure. 11. The inferior vena cava is normal in size with greater than 50% respiratory variability, suggesting right atrial pressure of 3 mmHg. FINDINGS  Left Ventricle: Left ventricular ejection fraction, by visual  estimation, is 60 to 65%. The left ventricle has normal function. There is no left ventricular hypertrophy. Left ventricular diastolic parameters are consistent with Grade I diastolic dysfunction (impaired relaxation). Normal left atrial pressure. Right Ventricle: The right ventricular size is normal. No increase in right ventricular wall thickness. Global RV systolic function is has normal systolic function. The tricuspid regurgitant velocity is 1.99 m/s, and with an assumed right atrial pressure  of 8 mmHg, the estimated right ventricular systolic pressure is normal at 23.8 mmHg. Left Atrium: Left atrial size was normal in size. Right Atrium: Right atrial size was normal in size Pericardium: There is no evidence of pericardial effusion. Mitral Valve: The mitral valve is normal in structure. No evidence of mitral valve stenosis by observation. No evidence of mitral valve regurgitation. Tricuspid Valve: The tricuspid valve is normal in structure. Tricuspid valve regurgitation is mild. Aortic Valve: The aortic valve is normal in structure. Aortic valve regurgitation is not visualized. The aortic valve is structurally normal, with no evidence of sclerosis or stenosis. Pulmonic Valve: The pulmonic valve was not assessed. Pulmonic valve regurgitation is not visualized. Aorta: The aortic root, ascending aorta and aortic arch are all structurally normal, with no evidence of dilitation or obstruction. Venous: The inferior vena cava is normal in size with greater than 50% respiratory variability, suggesting right atrial pressure of 3 mmHg. IAS/Shunts: No atrial level shunt detected by color flow Doppler. There is no evidence of a patent foramen ovale. No ventricular septal defect is seen or detected. There is no evidence of an atrial septal defect.  LEFT VENTRICLE PLAX 2D LVIDd:         3.30 cm  Diastology LVIDs:         2.10 cm  LV e' lateral:   13.40 cm/s LV PW:         1.00 cm  LV E/e' lateral: 3.3 LV IVS:        1.00 cm   LV e' medial:    9.57 cm/s LVOT diam:  1.50 cm  LV E/e' medial:  4.6 LV SV:         30 ml LV SV Index:   14.36 LVOT Area:     1.77 cm  RIGHT VENTRICLE RV S prime:     16.00 cm/s TAPSE (M-mode): 1.9 cm LEFT ATRIUM             Index       RIGHT ATRIUM           Index LA diam:        2.70 cm 1.30 cm/m  RA Area:     12.40 cm LA Vol (A2C):   31.1 ml 15.02 ml/m RA Volume:   24.30 ml  11.73 ml/m LA Vol (A4C):   28.9 ml 13.95 ml/m LA Biplane Vol: 30.5 ml 14.73 ml/m  AORTIC VALVE LVOT Vmax:   101.00 cm/s LVOT Vmean:  75.400 cm/s LVOT VTI:    0.161 m  AORTA Ao Root diam: 2.90 cm MITRAL VALVE                        TRICUSPID VALVE MV Area (PHT): 2.84 cm             TR Peak grad:   15.8 mmHg MV PHT:        77.43 msec           TR Vmax:        199.00 cm/s MV Decel Time: 267 msec MV E velocity: 44.10 cm/s 103 cm/s  SHUNTS MV A velocity: 96.10 cm/s 70.3 cm/s Systemic VTI:  0.16 m MV E/A ratio:  0.46       1.5       Systemic Diam: 1.50 cm  Ena Dawley MD Electronically signed by Ena Dawley MD Signature Date/Time: 05/27/2019/3:33:17 PM    Final    VAS [place] LOWER EXTREMITY VENOUS (DVT)  Result Date: 05/12/2019  Lower Venous Study Indications: Swelling.  Risk Factors: Cancer. Limitations: Poor ultrasound/tissue interface and Ascites. Comparison Study: No prior studies. Performing Technologist: Oliver Hum RVT  Examination Guidelines: A complete evaluation includes B-mode imaging, spectral Doppler, color Doppler, and power Doppler as needed of all accessible portions of each vessel. Bilateral testing is considered an integral part of a complete examination. Limited examinations for reoccurring indications may be performed as noted.  +-----+---------------+---------+-----------+----------+--------------+ RIGHTCompressibilityPhasicitySpontaneityPropertiesThrombus Aging +-----+---------------+---------+-----------+----------+--------------+ CFV  Full           Yes      Yes                                  +-----+---------------+---------+-----------+----------+--------------+   +---------+---------------+---------+-----------+----------+--------------+ LEFT     CompressibilityPhasicitySpontaneityPropertiesThrombus Aging +---------+---------------+---------+-----------+----------+--------------+ CFV      Partial        No       No                   Acute          +---------+---------------+---------+-----------+----------+--------------+ FV Prox  None                                         Acute          +---------+---------------+---------+-----------+----------+--------------+ FV Mid   None  Acute          +---------+---------------+---------+-----------+----------+--------------+ FV DistalNone                                         Acute          +---------+---------------+---------+-----------+----------+--------------+ PFV      Full                                                        +---------+---------------+---------+-----------+----------+--------------+ POP      None           No       No                   Acute          +---------+---------------+---------+-----------+----------+--------------+ PTV      None                                         Acute          +---------+---------------+---------+-----------+----------+--------------+ PERO     None                                         Acute          +---------+---------------+---------+-----------+----------+--------------+ EIV      None           No       No                   Acute          +---------+---------------+---------+-----------+----------+--------------+ Unable to visualize the common iliac vein, and inferior vena cava due to ascites.    Summary: Right: There is no evidence of deep vein thrombosis in the lower extremity. Left: Findings consistent with acute deep vein thrombosis involving the left external iliac vein,  common femoral vein, left femoral vein, left popliteal vein, left posterior tibial veins, and left peroneal veins. No cystic structure found in the popliteal fossa.  *See table(s) above for measurements and observations. Electronically signed by Servando Snare MD on 05/12/2019 at 5:43:03 PM.    Final     ASSESSMENT AND PLAN: 1. Metastatic adenocarcinoma unknown primary  CTs6/01/2019-large mass left hepatic lobe measuring 9.1 cm, few ill-defined lesions in the right hepatic lobe measuring up to 9 mm. Indeterminate gastrohepatic ligament lymph node measuring 1.1 cm. Shotty periportal and right pericardial adenopathy.  Biopsy liver lesion6/03/2019 -adenocarcinoma unknown primary most likely pancreatobiliary tree(including intrahepatic cholangiocarcinoma)and upper GI tract;CK7 positive; CK20, TTF-1, CDX2, GATA 3, p63 and PSA negative  Colonoscopy 12/09/2018-internal hemorrhoids, mild. Examination otherwise normal.  Upper endoscopy 12/09/2018-examined duodenum normal. Diffuse mild inflammation characterized by congestion and erythema in the gastric antrum.Biopsy showed chronic active gastritis with intestinal metaplasia; immunohistochemical staining positive for H pylori.  CT chest 12/13/2018-heterogeneous hypodense nodule left thyroid lobe. Previously reported small bilateral pleural effusions have resolved. Trace pericardial effusion. Interval development of several small lymph nodes in the right anterior cardiophrenic space measuring up to 1.2 x 0.8 cm. Also mildly prominent  lymph nodes in the periceliac region, gastrohepatic ligament and periportal area.  12/13/2018 CEA 8.2   12/28/2018 PET scan-multiple intensely FDG avid hepatic metastases. FDG avid anterior cardiophrenic, periportal, periceliac and gastrohepatic ligament lymph nodes. New trace right pleural effusion. Stable trace pericardial effusion.  Cycle 1 FOLFOX 01/10/2019   Cycle 2 FOLFOX 01/24/2019   01/24/2019 CEA 13.3, CA 19-9  163  CTs 02/01/2019-multiple confluent liver lesions noted occupying nearly the entire left lobe. More separated lesions in the right lobe have increased in size. At least 10-12 enlarging/new lesions in the right lobe of the liver.  Cycle 3 FOLFOX 02/07/2019  03/03/2019-left hepatic Y90  03/31/2019-right hepatic Y90  CTs 05/02/2019-liver lesions have significantly increased in number; othersare stable to increased in size since 02/01/2019 CT. Enlarged gastrohepatic ligament, portacaval, left periaortic and aortocaval lymph nodes appear stable. Anasarca, ascites and pleural effusions are essentially new.  Guardant 360 testing 05/12/2019- PIK3CA, IDH2 alterations, MSI high not detected, tumor mutation burden 8.57, ATM Vus  Cycle 1 gemcitabine/Abraxane 05/12/2019  Cycle 2 gemcitabine/Abraxane 05/26/2019 2. BPH 3. Hearing loss 4. Neuropathy 5. History of H. pylori status post treatment 6. History of atrial fibrillation 7. Asymmetric leg edema-referred for Doppler 8. Ascites-paracentesis 05/05/2019 with 5 L removed, cytology with reactive mesothelial cells, paracentesis 05/28/2019 with 5 L removed, cytology pending. 9. Left lower extremity DVT confirmed on Doppler 05/12/2019-apixaban started 10. Hospital admission 05/27/2019 - syncope  Darius. Ellis continues to have a decline in his performance status and poor appetite.  Plan is to discharge home with hospice.  He has elected not to pursue any additional chemotherapy.  He is developing worsening pancytopenia due to his recent chemotherapy.  Platelet count is now down to 49,000 and he is not actively bleeding.  However, he remains on Eliquis. Daughter initially declined a hospital bed but is now requesting is now requesting one for home use.   Recommendations: 1.  Discontinue Eliquis. 2.  I have placed a transitions of care consult to make sure that a hospital bed is ordered prior to discharge. 3.  The patient will be discharged home with  hospice when medically stable.  We will cancel all appointments at the cancer center per the patient's request.   LOS: 5 days   Mikey Bussing, DNP, AGPCNP-BC, AOCNP 06/01/19  Darius Ellis was interviewed and examined.  I discussed disposition plans with his daughter by telephone.  He will be discharged home with hospice care.  He is now at day 7 following a cycle of gemcitabine/Abraxane.  He has developed leukopenia and thrombocytopenia.  I discussed the risk/benefit of continuing anticoagulation therapy with his daughter.  I recommend discontinuing apixaban given the risk of bleeding and plan for comfort care.  His daughter is in agreement.  We will check a CBC on 06/01/2019.

## 2019-06-02 DIAGNOSIS — R188 Other ascites: Secondary | ICD-10-CM

## 2019-06-02 LAB — CBC WITH DIFFERENTIAL/PLATELET
Abs Immature Granulocytes: 0.04 10*3/uL (ref 0.00–0.07)
Basophils Absolute: 0 10*3/uL (ref 0.0–0.1)
Basophils Relative: 2 %
Eosinophils Absolute: 0 10*3/uL (ref 0.0–0.5)
Eosinophils Relative: 1 %
HCT: 34.3 % — ABNORMAL LOW (ref 39.0–52.0)
Hemoglobin: 11 g/dL — ABNORMAL LOW (ref 13.0–17.0)
Immature Granulocytes: 2 %
Lymphocytes Relative: 9 %
Lymphs Abs: 0.2 10*3/uL — ABNORMAL LOW (ref 0.7–4.0)
MCH: 29 pg (ref 26.0–34.0)
MCHC: 32.1 g/dL (ref 30.0–36.0)
MCV: 90.5 fL (ref 80.0–100.0)
Monocytes Absolute: 0.1 10*3/uL (ref 0.1–1.0)
Monocytes Relative: 6 %
Neutro Abs: 1.6 10*3/uL — ABNORMAL LOW (ref 1.7–7.7)
Neutrophils Relative %: 80 %
Platelets: 42 10*3/uL — ABNORMAL LOW (ref 150–400)
RBC: 3.79 MIL/uL — ABNORMAL LOW (ref 4.22–5.81)
RDW: 17.4 % — ABNORMAL HIGH (ref 11.5–15.5)
WBC: 2.1 10*3/uL — ABNORMAL LOW (ref 4.0–10.5)
nRBC: 0 % (ref 0.0–0.2)

## 2019-06-02 LAB — BASIC METABOLIC PANEL
Anion gap: 11 (ref 5–15)
BUN: 40 mg/dL — ABNORMAL HIGH (ref 8–23)
CO2: 22 mmol/L (ref 22–32)
Calcium: 9.3 mg/dL (ref 8.9–10.3)
Chloride: 103 mmol/L (ref 98–111)
Creatinine, Ser: 0.93 mg/dL (ref 0.61–1.24)
GFR calc Af Amer: >60 mL/min (ref >60)
GFR calc non Af Amer: >60 mL/min (ref >60)
Glucose, Bld: 109 mg/dL — ABNORMAL HIGH (ref 70–99)
Potassium: 3.6 mmol/L (ref 3.5–5.1)
Sodium: 136 mmol/L (ref 135–145)

## 2019-06-02 LAB — CYTOLOGY - NON PAP

## 2019-06-02 MED ORDER — CIPROFLOXACIN HCL 500 MG PO TABS: 500.0000 mg | ORAL_TABLET | Freq: Two times a day (BID) | ORAL | 0 refills | Status: AC

## 2019-06-02 MED ORDER — HYDROCODONE-ACETAMINOPHEN 5-325 MG PO TABS: 1.0000 | ORAL_TABLET | Freq: Four times a day (QID) | ORAL | 0 refills | Status: AC | PRN

## 2019-06-02 MED ORDER — FOLIC ACID 1 MG PO TABS: 1.0000 mg | ORAL_TABLET | Freq: Every day | ORAL | Status: AC

## 2019-06-02 MED ORDER — PRO-STAT SUGAR FREE PO LIQD: 30.0000 mL | Freq: Four times a day (QID) | ORAL | 0 refills | Status: AC

## 2019-06-02 MED ORDER — FUROSEMIDE 20 MG PO TABS: 20.0000 mg | ORAL_TABLET | Freq: Two times a day (BID) | ORAL | 1 refills | Status: AC

## 2019-06-02 MED ORDER — SENNOSIDES-DOCUSATE SODIUM 8.6-50 MG PO TABS: 1.0000 | ORAL_TABLET | Freq: Every evening | ORAL | Status: AC | PRN

## 2019-06-02 MED ORDER — ALBUTEROL SULFATE HFA 108 (90 BASE) MCG/ACT IN AERS: 2.0000 | INHALATION_SPRAY | Freq: Four times a day (QID) | RESPIRATORY_TRACT | 0 refills | Status: AC | PRN

## 2019-06-02 MED ORDER — HEPARIN SOD (PORK) LOCK FLUSH 100 UNIT/ML IV SOLN
500.0000 [IU] | INTRAVENOUS | Status: AC | PRN
  Administered 2019-06-02: 500 [IU]
  Filled 2019-06-02: qty 5

## 2019-06-02 MED ORDER — ENSURE ENLIVE PO LIQD: 237.0000 mL | Freq: Two times a day (BID) | ORAL | 12 refills | Status: AC

## 2019-06-02 NOTE — Discharge Summary (Signed)
Physician Discharge Summary  Darius Ellis [id number] DOB: Nov 18, 1940 DOA: 05/27/2019  PCP: Libby Maw, MD  Admit date: 05/27/2019 Discharge date: 06/02/2019  Time spent: 60 minutes  Recommendations for Outpatient Follow-up:  1. Patient be discharged home with hospice. 2. Follow-up with Dr. Benay Spice, hematology/oncology in 1 to 2 weeks.   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 coli UTI   Fever   Discharge Condition: Stable  Diet recommendation: Regular  Filed Weights   05/27/19 0035  Weight: 82.9 kg    History of present illness:  HPI per Dr. Channing Mutters is a 78 y.o. male with medical history significant of metastatic liver cancer who had received recent chemotherapy fell in the bathroom.  He also has a history of atrial fibrillation and hypertension.  He had at 1 point suffered from PTSD.  When his daughter attended to him he was awake.  He did have incontinence of urine.  Daughter did not see any convulsive activity. Patient has not had any major complaints such as headache nausea or vomiting.Marland Kitchen He denies any chest pain. He has some shortness of breath at rest.. He has no abdominal pain.  Does have some diarrhea when he takes nutritional supplements.  Appetite has been fair.  He has had 2 abdominal paracentesis and each time his appetite improves afterwards. According to his daughter who is a Marine scientist he has been dwindling for the past several days. ED Course: Patient was evaluated.  Cardiology was consulted  because of his elevated troponins.  It was suggested that the chemotherapy may have caused the cardiotoxicity.  At this point there is not much to do he is already on Eliquis.    Hospital Course:  1 generalized weakness with recurrent falls/??  Syncopal episode Questionable etiology.  Cardiac enzymes were cycled which were elevated and however subsequently trended down.  There was some concern for orthostasis.  Patient received some fluid boluses.  SARS-CoV-2 which was done was negative.  Urinalysis unremarkable.  Head CT was negative for any acute abnormalities.  CT angiogram chest was negative for PE however did show small bilateral pleural effusions left greater than right with probable adjacent compressive atelectasis.  2D echo which was done showed a normal left ventricular right ventricular systolic function and size with right atrial pressure estimated to be less than 3 mmHg.  Patient was seen by cardiology and was felt patient's falls likely secondary to orthostasis and recommended incentive spirometry with continued anticoagulation and no further recommendations.  Patient underwent paracentesis with 5 L removed and still with some abdominal distention and underwent repeat ultrasound-guided paracentesis reordered per oncology on 05/31/2019 with 3.7 L of fluid removed..  Palliative care following as patient with a failure to thrive with no significant improvement.  Patient and family decided to transition to comfort measures.  Patient will be discharged home with hospice.   2.  Dyspnea Patient noted to have progressive worsening shortness of breath which initially improved post paracentesis with 5 L removed on 05/22/2019.  2D  echo which was done with normal EF with no wall motion abnormalities.  Patient initially given some IV Lasix which was initially held due to borderline blood pressure.  Patient also received a dose of IV albumin.  Patient with some abdominal distention and as such repeat  ultrasound-guided paracentesis ordered per oncology on 05/31/2019 with 3.7 L of fluid removed..  Cardiology was following however has signed off.    Patient improved and was stable by day of discharge.  Due to patient's deterioration decision was made to transition to comfort measures.  Patient will be discharged home with hospice.   3.  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  Therapeutic ultrasound-guided paracentesis was done with 5 L removed.  Patient with some abdominal distention again and underwent repeat ultrasound-guided paracentesis on 05/31/2019 with 3.7 L of slightly hazy, yellow fluid removed and sample sent for cytology as ordered per oncology.  Patient status post IV albumin every 6 hours x1 day. Patient with no significant improvement and seems to be deteriorating.  Palliative care following and family decided on transitioning to comfort measures.  Patient will be discharged home with hospice following.   4.  BPH/acute urinary retention Patient was maintained on home regimen of finasteride and tamsulosin. I and O cath prn. Follow.  5.  Metastatic adenocarcinoma with unknown primary Patient noted to have undergone chemotherapy and gemcitabine/Abraxane on 05/26/2019.  Patient with no significant improvement.  Oncology following.  Palliative care following.  Family subsequently decided on transitioning to comfort measures.  Patient be discharged home with hospice following.  Follow-up with oncology in the outpatient setting.   6.  Pancytopenia Likely secondary to recent chemotherapy.  Patient with no overt bleeding.  Counts started to trend down and seem to be stabilizing by day of discharge.  Oncology was following and felt patient's counts have stabilized enough for patient to be discharged home safely.  Patient will be discharged home with hospice.    7.  Left lower  extremity DVT Per lower extremity Dopplers of 05/12/2019.  Patient noted to have thrombocytopenia secondary to recent chemotherapy with platelet count dropping to 42,000. Case was discussed with oncology who recommended discontinuation of Eliquis.    Patient was discharged home with hospice off of anticoagulation.  Follow-up with hematology/oncology.   8.  Fever/E. coli UTI Likely tumor fever versus UTI.  Patient pancultured.  Urinalysis nitrite positive, trace leukocytes, 11-20 WBCs.  Ultrasound-guided paracentesis which was done negative for SBP.  Chest x-ray negative for any acute infiltrate and patient without a cough or significant respiratory symptoms.  Body fluid cultures negative.  Blood cultures negative to date.  Urine cultures with 10,000 colonies of E. Coli, sensitive to the cephalosporins and quinolones as well as Bactrim, Macrobid.    Patient was maintained on IV Rocephin during the hospitalization and will be transitioned to oral ciprofloxacin on discharge for 5 more days to complete a full course of antibiotic treatment.   9.  Hyponatremia Improved.  Follow.  10.  Goals of care/DNR, POA Patient seen by palliative care and oncology.  Patient not improving significantly clinically and seems to be deteriorating.  Palliative care following and family decided to transition to comfort measures and patient will be discharged home with hospice.     Procedures:  CT angiogram chest 05/27/2019  CT head 05/27/2019  Chest x-ray 05/27/2019, 05/29/2019, 05/30/2019, 05/31/2019  Ultrasound-guided paracentesis 05/28/2019--- 5 L of  slightly hazy, yellow fluid removed.  Ultrasound-guided paracentesis 05/31/2019   2D echo 05/27/2019   Consultations:  Palliative care: Jobe Gibbon 05/29/2019  Cardiology: Dr. Caryl Comes 05/27/2019  Oncology: Dr. Benay Spice  Discharge Exam: Vitals:   06/01/19 2040 06/02/19 0507  BP: 102/61 122/71  Pulse: 88 98  Resp: 20 20  Temp: 98.3 F (36.8 C)  97.8 F (36.6 C)  SpO2: 94% 94%    General: NAD Cardiovascular: RRR Respiratory: CTAB anterior lung fields.  Discharge Instructions   Discharge Instructions    Diet general   Complete by: As directed    Increase activity slowly   Complete by: As directed      Allergies as of 06/02/2019   No Known Allergies     Medication List    STOP taking these medications   Apixaban Starter Pack 5 MG Tbpk Commonly known as: ELIQUIS STARTER PACK   predniSONE 10 MG (48) Tbpk tablet Commonly known as: STERAPRED UNI-PAK 48 TAB     TAKE these medications   albuterol 108 (90 Base) MCG/ACT inhaler Commonly known as: VENTOLIN HFA Inhale 2 puffs into the lungs every 6 (six) hours as needed for wheezing or shortness of breath.   cetirizine 10 MG tablet Commonly known as: ZYRTEC Take 10 mg by mouth daily.   ciprofloxacin 500 MG tablet Commonly known as: CIPRO Take 1 tablet (500 mg total) by mouth 2 (two) times daily for 5 days.   feeding supplement (ENSURE ENLIVE) Liqd Take 237 mLs by mouth 2 (two) times daily between meals.   feeding supplement (PRO-STAT SUGAR FREE 64) Liqd Take 30 mLs by mouth 4 (four) times daily.   finasteride 5 MG tablet Commonly known as: PROSCAR Take 5 mg by mouth daily.   fluticasone 50 MCG/ACT nasal spray Commonly known as: FLONASE Place 2 sprays into both nostrils daily.   folic acid 1 MG tablet Commonly known as: FOLVITE Take 1 tablet (1 mg total) by mouth daily. Start taking on: June 03, 2019   furosemide 20 MG tablet Commonly known as: LASIX Take 1 tablet (20 mg total) by mouth 2 (two) times daily.   gabapentin 300 MG capsule Commonly known as: NEURONTIN Take 600 mg by mouth 2 (two) times daily.   GLUCOSAMINE-CHONDROITIN PO Take 1 tablet by mouth daily. Glucosamine 1500 mg/chondroitin 1200 mg   HYDROcodone-acetaminophen 5-325 MG tablet Commonly known as: Norco Take 1-2 tablets by mouth every 6 (six) hours as needed for moderate  pain.   naproxen sodium 220 MG tablet Commonly known as: ALEVE Take 220 mg by mouth 2 (two) times daily as needed (pain).   ondansetron 8 MG tablet Commonly known as: ZOFRAN Take 8 mg by mouth every 8 (eight) hours as needed for nausea or vomiting.   prochlorperazine 10 MG tablet Commonly known as: COMPAZINE Take 10 mg by mouth every 6 (six) hours as needed for nausea or vomiting.   senna-docusate 8.6-50 MG tablet Commonly known as: Senokot-S Take 1 tablet by mouth at bedtime as needed for mild constipation.   tamsulosin 0.4 MG Caps capsule Commonly known as: FLOMAX Take 0.4 mg by mouth.   topiramate 25 MG tablet Commonly known as: TOPAMAX Take 50 mg by mouth at bedtime.   vitamin B-12 1000 MCG tablet Commonly known as: CYANOCOBALAMIN Take 1,000 mcg by mouth daily.            Durable Medical Equipment  (From admission, onward)         Start     Ordered  06/01/19 0805  For home use only DME 3 n 1  Once     06/01/19 0804         No Known Allergies Follow-up Information    Ladell Pier, MD. Schedule an appointment as soon as possible for a visit in 2 week(s).   Specialty: Oncology Why: f/u in 1-2 weeks  Contact information: [address] [place] [id number] [phone]            The results of significant diagnostics from this hospitalization (including imaging, microbiology, ancillary and laboratory) are listed below for reference.    Significant Diagnostic Studies: CT Head Wo Contrast  Result Date: 05/27/2019 CLINICAL DATA:  Lightheadedness.  Syncope. EXAM: CT HEAD WITHOUT CONTRAST TECHNIQUE: Contiguous axial images were obtained from the base of the skull through the vertex without intravenous contrast. COMPARISON:  April 10, 2019 FINDINGS: Brain: No subdural, epidural, or subarachnoid hemorrhage. Cerebellum, brainstem, and basal sys cisterns are normal. 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 No acute cortical  ischemia infarct noted. No mass effect or midline shift. Ventricles and sulci are stable. Vascular: No hyperdense vessel or unexpected calcification. Skull: Normal. Negative for fracture or focal lesion. Sinuses/Orbits: No acute finding. Other: None. IMPRESSION: No acute intracranial abnormalities. Electronically Signed   By: Dorise Bullion III M.D   On: 05/27/2019 03:17   CT Angio Chest PE W and/or Wo Contrast  Result Date: 05/27/2019 CLINICAL DATA:  Dizziness and syncope EXAM: CT ANGIOGRAPHY CHEST WITH CONTRAST TECHNIQUE: Multidetector CT imaging of the chest was performed using the standard protocol during bolus administration of intravenous contrast. Multiplanar CT image reconstructions and MIPs were obtained to evaluate the vascular anatomy. CONTRAST:  137mL OMNIPAQUE IOHEXOL 350 MG/ML SOLN COMPARISON:  May 02, 2019 FINDINGS: Cardiovascular: There is a optimal opacification of the pulmonary arteries. There is no central,segmental, or subsegmental filling defects within the pulmonary arteries. The heart is normal in size. No pericardial effusion or thickening. No evidence right heart strain. There is normal three-vessel brachiocephalic anatomy without proximal stenosis. Scattered aortic atherosclerotic calcifications are seen without aneurysmal dilatation. Mediastinum/Nodes: No hilar, mediastinal, or axillary adenopathy. Again noted is a 1.4 cm low-density lesion within the left thyroid lobe. Lungs/Pleura: There are small bilateral pleural effusions, left greater right. Patchy/streaky airspace opacity seen at both lung bases which could be compressive atelectasis. Upper Abdomen: There is partially visualized multiple hypodense lesions seen throughout the liver. A moderate amount of abdominal ascites is seen. For Musculoskeletal: No chest wall abnormality. No acute or significant osseous findings. Anterior flowing osteophytes seen throughout the thoracic spine. Review of the MIP images confirms the  above findings. IMPRESSION: 1. no central, segmental, or subsegmental pulmonary embolism. 2. Small bilateral pleural effusions, left greater than right with probable adjacent compressive atelectasis. 3. Partially visualized multiple hypodense liver lesions. 4. Moderate abdominal ascites. Electronically Signed   By: Prudencio Pair M.D.   On: 05/27/2019 03:40   US Abdomen Complete  Result Date: 05/27/2019 CLINICAL DATA:  Acute renal failure, ascites metastatic disease to the liver. EXAM: ABDOMEN ULTRASOUND COMPLETE COMPARISON:  05/27/2019 and 05/02/2019 FINDINGS: Gallbladder: Gallbladder is contracted with wall thickening in the setting of generalized edema. No sonographic Murphy's sign reported by the sonographer. Common bile duct: Diameter: 3 mm Liver: Diffuse heterogeneity of the liver with multiple areas of hypo coexisting metastatic disease. These are better displayed on recent CT evaluations. Portal vein is patent on color Doppler imaging with normal direction of blood flow towards the  liver. IVC: No abnormality visualized. Pancreas: Visualized portion unremarkable. Spleen: Size and appearance within normal limits. Right Kidney: Length: 9.5 cm. Cortical thinning. No signs of hydronephrosis. Renal sinus lipomatosis. Hypoechoic renal lesions likely small proteinaceous or hemorrhagic cysts unchanged from recent CT of the chest, abdomen and pelvis. Left Kidney: Length: 11.5 cm. Limited assessment of the left kidney. Visualized portions are normal. No visible hydronephrosis. Abdominal aorta: Distal portion of the aorta at the bifurcation is not well visualized. No signs of aneurysm and visible portions. Other findings: Ascites present in all 4 quadrants. Signs of bowel edema, generalized likely related to underlying anasarca. IMPRESSION: 1. Multiple metastatic lesions in the liver. 2. Signs of anasarca with ascites and generalized edema. 3. No signs of biliary ductal dilation. Electronically Signed   By: Zetta Bills M.D.   On: 05/27/2019 11:09   US Paracentesis  Result Date: 05/31/2019 INDICATION: Patient with history of metastatic adenocarcinoma of unknown primary, left lower extremity DVT, recurrent ascites. Request received for diagnostic and therapeutic paracentesis. EXAM: ULTRASOUND GUIDED DIAGNOSTIC AND THERAPEUTIC PARACENTESIS MEDICATIONS: None COMPLICATIONS: None immediate. PROCEDURE: Informed written consent was obtained from the patient's daughter after a discussion of the risks, benefits and alternatives to treatment. A timeout was performed prior to the initiation of the procedure. Initial ultrasound scanning demonstrates a moderate to large amount of ascites within the right mid to lower abdominal quadrant. The right mid to lower abdomen was prepped and draped in the usual sterile fashion. 1% lidocaine was used for local anesthesia. Following this, a 19 gauge, 7-cm, Yueh catheter was introduced. An ultrasound image was saved for documentation purposes. The paracentesis was performed. The catheter was removed and a dressing was applied. The patient tolerated the procedure well without immediate post procedural complication. Patient received post-procedure intravenous albumin; see nursing notes for details. FINDINGS: A total of approximately 3.7 liters of slightly hazy, yellow fluid was removed. Samples were sent to the laboratory as requested by the clinical team. IMPRESSION: Successful ultrasound-guided diagnostic and therapeutic paracentesis yielding 3.7 liters of peritoneal fluid. Read by: Rowe Robert, PA-C Electronically Signed   By: Markus Daft M.D.   On: 05/31/2019 17:02   US Paracentesis  Result Date: 05/28/2019 INDICATION: Patient with history of metastatic adenocarcinoma of unknown primary, left lower extremity DVT, recurrent ascites; request received for diagnostic and therapeutic paracentesis. EXAM: ULTRASOUND GUIDED DIAGNOSTIC AND THERAPEUTIC PARACENTESIS MEDICATIONS: None COMPLICATIONS:  None immediate. PROCEDURE: Informed written consent was obtained from the patient after a discussion of the risks, benefits and alternatives to treatment. A timeout was performed prior to the initiation of the procedure. Initial ultrasound scanning demonstrates a large amount of ascites within the right mid to lower abdominal quadrant. The right mid to lower abdomen was prepped and draped in the usual sterile fashion. 1% lidocaine was used for local anesthesia. Following this, a 19 gauge, 10-cm, Yueh catheter was introduced. An ultrasound image was saved for documentation purposes. The paracentesis was performed. The catheter was removed and a dressing was applied. The patient tolerated the procedure well without immediate post procedural complication. Patient was administered 25 grams IV albumin by floor nurse on unit FINDINGS: A total of approximately 5 liters of slightly hazy, yellow fluid was removed. Samples were sent to the laboratory as requested by the clinical team. IMPRESSION: Successful ultrasound-guided diagnostic and therapeutic paracentesis yielding 5 liters of peritoneal fluid. Read by: Rowe Robert, PA-C Electronically Signed   By: Lucrezia Europe M.D.   On: 05/28/2019 14:52   US  Paracentesis  Result Date: 05/22/2019 INDICATION: Patient with history of metastatic adenocarcinoma of unknown primary with ascites. Request was made for therapeutic and diagnostic paracentesis up to 5 L maximum EXAM: ULTRASOUND GUIDED THERAPEUTIC AND DIAGNOSTIC PARACENTESIS MEDICATIONS: None. COMPLICATIONS: None immediate. PROCEDURE: Informed written consent was obtained from the patient after a discussion of the risks, benefits and alternatives to treatment. A timeout was performed prior to the initiation of the procedure. Initial ultrasound scanning demonstrates a large amount of ascites within the right lower abdominal quadrant. The right lower abdomen was prepped and draped in the usual sterile fashion. 1% lidocaine was  used for local anesthesia. Following this, a 19 gauge, 7-cm, Yueh catheter was introduced. An ultrasound image was saved for documentation purposes. The paracentesis was performed. The catheter was removed and a dressing was applied. The patient tolerated the procedure well without immediate post procedural complication. FINDINGS: A total of approximately 5 L of straw colored fluid was removed. Samples were sent to the laboratory as requested by the clinical team. IMPRESSION: Successful ultrasound-guided therapeutic and diagnostic paracentesis yielding 5 liters of peritoneal fluid. Read by Rushie Nyhan NP Electronically Signed   By: Sandi Mariscal M.D.   On: 05/22/2019 11:01   US Paracentesis  Result Date: 05/05/2019 INDICATION: Patient with history of metastatic adenocarcinoma of unknown primary, ascites. Request made for diagnostic and therapeutic paracentesis up to 5 liters. EXAM: ULTRASOUND GUIDED DIAGNOSTIC AND THERAPEUTIC PARACENTESIS MEDICATIONS: None COMPLICATIONS: None immediate. PROCEDURE: Informed written consent was obtained from the patient after a discussion of the risks, benefits and alternatives to treatment. A timeout was performed prior to the initiation of the procedure. Initial ultrasound scanning demonstrates a large amount of ascites within the left mid to lower abdominal quadrant. The left mid to lower abdomen was prepped and draped in the usual sterile fashion. 1% lidocaine was used for local anesthesia. Following this, a 19 gauge, 7-cm, Yueh catheter was introduced. An ultrasound image was saved for documentation purposes. The paracentesis was performed. The catheter was removed and a dressing was applied. The patient tolerated the procedure well without immediate post procedural complication. FINDINGS: A total of approximately 5 liters of yellow fluid was removed. Samples were sent to the laboratory as requested by the clinical team. IMPRESSION: Successful ultrasound-guided  diagnostic and therapeutic paracentesis yielding 5 liters of peritoneal fluid. Read by: Rowe Robert, PA-C Electronically Signed   By: Sandi Mariscal M.D.   On: 05/05/2019 16:51   DG CHEST PORT 1 VIEW  Result Date: 05/31/2019 CLINICAL DATA:  Shortness of breath. EXAM: PORTABLE CHEST 1 VIEW COMPARISON:  Radiograph yesterday. CT 05/27/2019 FINDINGS: Left chest port remains in place. Unchanged heart size and mediastinal contours. Slight worsening left basilar opacity likely combination of pleural fluid and airspace disease. Slight worsening in streaky right infrahilar opacities. No pulmonary edema or pneumothorax. IMPRESSION: 1. Slight worsening left basilar opacity likely combination of pleural fluid and airspace disease. 2. Slight worsening streaky right infrahilar opacities, favor atelectasis. Small right pleural effusion on CT is not well seen radiographically. Electronically Signed   By: Keith Rake M.D.   On: 05/31/2019 05:23   DG CHEST PORT 1 VIEW  Result Date: 05/30/2019 CLINICAL DATA:  Fever.  Chemotherapy. EXAM: PORTABLE CHEST 1 VIEW COMPARISON:  Radiograph yesterday. CT 05/27/2019 FINDINGS: Axis left chest port remains in place. Lower lung volumes from prior exam. Increasing hazy bibasilar opacities. Increasing perivascular haziness/vascular congestion. Changed heart size and mediastinal contours. Aortic atherosclerosis and tortuosity. No pneumothorax. IMPRESSION: 1. Increasing hazy  bibasilar opacities, likely combination of pleural effusions a compressive atelectasis. Difficult to exclude superimposed pneumonia. 2. Increasing perivascular haziness/vascular congestion. 3. Aortic atherosclerosis. Electronically Signed   By: Keith Rake M.D.   On: 05/30/2019 01:18   DG CHEST PORT 1 VIEW  Result Date: 05/29/2019 CLINICAL DATA:  Shortness of breath EXAM: PORTABLE CHEST 1 VIEW COMPARISON:  Chest CT from 2 days ago FINDINGS: Normal heart size and unremarkable mediastinal contours. Left-sided  port with tip in good position. Bands of opacity with haziness and costophrenic sulcus blunting greater on the left. No edema or pneumothorax. IMPRESSION: Atelectasis and small pleural effusions. Aeration has improved from 2 days ago. Electronically Signed   By: Monte Fantasia M.D.   On: 05/29/2019 05:16   DG Chest Portable 1 View  Result Date: 05/27/2019 CLINICAL DATA:  Dizziness and syncope EXAM: PORTABLE CHEST 1 VIEW COMPARISON:  None. FINDINGS: There is mild cardiomegaly. A left-sided MediPort catheter is seen with the tip at the lower SVC. There is moderate left and small right pleural effusion. Hazy airspace opacity seen adjacent to lower lungs which is likely layering effusion and/or compressive atelectasis. No acute osseous abnormality. IMPRESSION: Moderate left and small right pleural effusion with probable adjacent layering effusion and/or atelectasis. Electronically Signed   By: Prudencio Pair M.D.   On: 05/27/2019 01:00   ECHOCARDIOGRAM COMPLETE  Result Date: 05/27/2019   ECHOCARDIOGRAM REPORT   Patient Name:   ASON CASTROGIOVANNI Date of Exam: 05/27/2019 Medical Rec #:  [id number]      Height:       73.0 in Accession #:    [id number]     Weight:       182.8 lb Date of Birth:  07/20/1940      BSA:          2.07 m Patient Age:    61 years       BP:           103/73 mmHg Patient Gender: M              HR:           76 bpm. Exam Location:  Inpatient Procedure: 2D Echo, Cardiac Doppler and Color Doppler Indications:    Dyspnea 786.09  History:        Patient has no prior history of Echocardiogram examinations.                 Signs/Symptoms:Syncope; Risk Factors:Current Smoker. Elevated                 troponin.  Sonographer:    Paulita Fujita RDCS Referring Phys: [id number] [facility] LATIF [facility]  Sonographer Comments: Suboptimal parasternal window. IMPRESSIONS  1. Left ventricular ejection fraction, by visual estimation, is 60 to 65%. The left ventricle has normal function. There is no left ventricular  hypertrophy.  2. Left ventricular diastolic parameters are consistent with Grade I diastolic dysfunction (impaired relaxation).  3. Global right ventricle has normal systolic function.The right ventricular size is normal. No increase in right ventricular wall thickness.  4. Left atrial size was normal.  5. Right atrial size was normal.  6. The mitral valve is normal in structure. No evidence of mitral valve regurgitation. No evidence of mitral stenosis.  7. The tricuspid valve is normal in structure. Tricuspid valve regurgitation is mild.  8. The aortic valve is normal in structure. Aortic valve regurgitation is not visualized. No evidence of aortic valve sclerosis or stenosis.  9. The pulmonic valve was  not assessed. Pulmonic valve regurgitation is not visualized. 10. Normal pulmonary artery systolic pressure. 11. The inferior vena cava is normal in size with greater than 50% respiratory variability, suggesting right atrial pressure of 3 mmHg. FINDINGS  Left Ventricle: Left ventricular ejection fraction, by visual estimation, is 60 to 65%. The left ventricle has normal function. There is no left ventricular hypertrophy. Left ventricular diastolic parameters are consistent with Grade I diastolic dysfunction (impaired relaxation). Normal left atrial pressure. Right Ventricle: The right ventricular size is normal. No increase in right ventricular wall thickness. Global RV systolic function is has normal systolic function. The tricuspid regurgitant velocity is 1.99 m/s, and with an assumed right atrial pressure  of 8 mmHg, the estimated right ventricular systolic pressure is normal at 23.8 mmHg. Left Atrium: Left atrial size was normal in size. Right Atrium: Right atrial size was normal in size Pericardium: There is no evidence of pericardial effusion. Mitral Valve: The mitral valve is normal in structure. No evidence of mitral valve stenosis by observation. No evidence of mitral valve regurgitation. Tricuspid Valve:  The tricuspid valve is normal in structure. Tricuspid valve regurgitation is mild. Aortic Valve: The aortic valve is normal in structure. Aortic valve regurgitation is not visualized. The aortic valve is structurally normal, with no evidence of sclerosis or stenosis. Pulmonic Valve: The pulmonic valve was not assessed. Pulmonic valve regurgitation is not visualized. Aorta: The aortic root, ascending aorta and aortic arch are all structurally normal, with no evidence of dilitation or obstruction. Venous: The inferior vena cava is normal in size with greater than 50% respiratory variability, suggesting right atrial pressure of 3 mmHg. IAS/Shunts: No atrial level shunt detected by color flow Doppler. There is no evidence of a patent foramen ovale. No ventricular septal defect is seen or detected. There is no evidence of an atrial septal defect.  LEFT VENTRICLE PLAX 2D LVIDd:         3.30 cm  Diastology LVIDs:         2.10 cm  LV e' lateral:   13.40 cm/s LV PW:         1.00 cm  LV E/e' lateral: 3.3 LV IVS:        1.00 cm  LV e' medial:    9.57 cm/s LVOT diam:     1.50 cm  LV E/e' medial:  4.6 LV SV:         30 ml LV SV Index:   14.36 LVOT Area:     1.77 cm  RIGHT VENTRICLE RV S prime:     16.00 cm/s TAPSE (M-mode): 1.9 cm LEFT ATRIUM             Index       RIGHT ATRIUM           Index LA diam:        2.70 cm 1.30 cm/m  RA Area:     12.40 cm LA Vol (A2C):   31.1 ml 15.02 ml/m RA Volume:   24.30 ml  11.73 ml/m LA Vol (A4C):   28.9 ml 13.95 ml/m LA Biplane Vol: 30.5 ml 14.73 ml/m  AORTIC VALVE LVOT Vmax:   101.00 cm/s LVOT Vmean:  75.400 cm/s LVOT VTI:    0.161 m  AORTA Ao Root diam: 2.90 cm MITRAL VALVE                        TRICUSPID VALVE MV Area (PHT): 2.84 cm  TR Peak grad:   15.8 mmHg MV PHT:        77.43 msec           TR Vmax:        199.00 cm/s MV Decel Time: 267 msec MV E velocity: 44.10 cm/s 103 cm/s  SHUNTS MV A velocity: 96.10 cm/s 70.3 cm/s Systemic VTI:  0.16 m MV E/A ratio:  0.46        1.5       Systemic Diam: 1.50 cm  Ena Dawley MD Electronically signed by Ena Dawley MD Signature Date/Time: 05/27/2019/3:33:17 PM    Final    VAS [place] LOWER EXTREMITY VENOUS (DVT)  Result Date: 05/12/2019  Lower Venous Study Indications: Swelling.  Risk Factors: Cancer. Limitations: Poor ultrasound/tissue interface and Ascites. Comparison Study: No prior studies. Performing Technologist: Oliver Hum RVT  Examination Guidelines: A complete evaluation includes B-mode imaging, spectral Doppler, color Doppler, and power Doppler as needed of all accessible portions of each vessel. Bilateral testing is considered an integral part of a complete examination. Limited examinations for reoccurring indications may be performed as noted.  +-----+---------------+---------+-----------+----------+--------------+ RIGHTCompressibilityPhasicitySpontaneityPropertiesThrombus Aging +-----+---------------+---------+-----------+----------+--------------+ CFV  Full           Yes      Yes                                 +-----+---------------+---------+-----------+----------+--------------+   +---------+---------------+---------+-----------+----------+--------------+ LEFT     CompressibilityPhasicitySpontaneityPropertiesThrombus Aging +---------+---------------+---------+-----------+----------+--------------+ CFV      Partial        No       No                   Acute          +---------+---------------+---------+-----------+----------+--------------+ FV Prox  None                                         Acute          +---------+---------------+---------+-----------+----------+--------------+ FV Mid   None                                         Acute          +---------+---------------+---------+-----------+----------+--------------+ FV DistalNone                                         Acute          +---------+---------------+---------+-----------+----------+--------------+  PFV      Full                                                        +---------+---------------+---------+-----------+----------+--------------+ POP      None           No       No                   Acute          +---------+---------------+---------+-----------+----------+--------------+ PTV      None  Acute          +---------+---------------+---------+-----------+----------+--------------+ PERO     None                                         Acute          +---------+---------------+---------+-----------+----------+--------------+ EIV      None           No       No                   Acute          +---------+---------------+---------+-----------+----------+--------------+ Unable to visualize the common iliac vein, and inferior vena cava due to ascites.    Summary: Right: There is no evidence of deep vein thrombosis in the lower extremity. Left: Findings consistent with acute deep vein thrombosis involving the left external iliac vein, common femoral vein, left femoral vein, left popliteal vein, left posterior tibial veins, and left peroneal veins. No cystic structure found in the popliteal fossa.  *See table(s) above for measurements and observations. Electronically signed by Servando Snare MD on 05/12/2019 at 5:43:03 PM.    Final     Microbiology: Recent Results (from the past 240 hour(s))  SARS CORONAVIRUS 2 (TAT 6-24 HRS) Nasopharyngeal Nasopharyngeal Swab     Status: None   Collection Time: 05/27/19  4:53 AM   Specimen: Nasopharyngeal Swab  Result Value Ref Range Status   SARS Coronavirus 2 NEGATIVE NEGATIVE Final    Comment: (NOTE) SARS-CoV-2 target nucleic acids are NOT DETECTED. The SARS-CoV-2 RNA is generally detectable in upper and lower respiratory specimens during the acute phase of infection. Negative results do not preclude SARS-CoV-2 infection, do not rule out co-infections with other pathogens, and should not  be used as the sole basis for treatment or other patient management decisions. Negative results must be combined with clinical observations, patient history, and epidemiological information. The expected result is Negative. Fact Sheet for Patients: SugarRoll.be Fact Sheet for Healthcare Providers: https://www.woods-mathews.com/ This test is not yet approved or cleared by the [place] FDA and  has been authorized for detection and/or diagnosis of SARS-CoV-2 by FDA under an Emergency Use Authorization (EUA). This EUA will remain  in effect (meaning this test can be used) for the duration of the COVID-19 declaration under Section 56 4(b)(1) of the Act, 21 U.S.C. section 360bbb-3(b)(1), unless the authorization is terminated or revoked sooner. Performed at [facility], [place] [address], [place], [place] [address]   Fungus Culture With Stain     Status: None (Preliminary result)   Collection Time: 05/28/19  1:51 PM   Specimen: PATH Cytology Peritoneal fluid  Result Value Ref Range Status   Fungus Stain Final report  Final    Comment: (NOTE) Performed At: [facility] [place], [place] [id number] Rush Farmer MD [id number]    Fungus (Mycology) Culture PENDING  Incomplete   Fungal Source CYTO  Final    Comment: PERITONEAL Performed at [facility], [place] [address], [place], [place] [address]   Culture, body fluid-bottle     Status: None (Preliminary result)   Collection Time: 05/28/19  1:51 PM   Specimen: Fluid  Result Value Ref Range Status   Specimen Description FLUID PERITONEAL  Final   Special Requests   Final    BOTTLES DRAWN AEROBIC AND ANAEROBIC Blood Culture adequate volume  Culture   Final    NO GROWTH 4 DAYS Performed at [facility], [place] [address] [place], [place] [address]    Report Status PENDING  Incomplete  Gram stain     Status: None   Collection Time:  05/28/19  1:51 PM   Specimen: Fluid  Result Value Ref Range Status   Specimen Description FLUID PERITONEAL  Final   Special Requests NONE  Final   Gram Stain   Final    NO WBC SEEN NO ORGANISMS SEEN Performed at [facility], [address], [place], [place] [address]    Report Status 05/29/2019 FINAL  Final  Fungus Culture Result     Status: None   Collection Time: 05/28/19  1:51 PM  Result Value Ref Range Status   Result 1 Comment  Final    Comment: (NOTE) KOH/Calcofluor preparation:  no fungus observed. Performed At: [facility] [place], [place] [id number] Rush Farmer MD [id number]   Culture, blood (routine x 2)     Status: None (Preliminary result)   Collection Time: 05/30/19  1:05 AM   Specimen: BLOOD  Result Value Ref Range Status   Specimen Description   Final    BLOOD RIGHT ANTECUBITAL Performed at [facility] [address] [place], [place] [address]    Special Requests   Final    BOTTLES DRAWN AEROBIC AND ANAEROBIC Blood Culture adequate volume Performed at [facility] [address], [place], [place] [address]    Culture   Final    NO GROWTH 3 DAYS Performed at [facility], [place] [address], [place], [place] [address]    Report Status PENDING  Incomplete  Culture, blood (routine x 2)     Status: None (Preliminary result)   Collection Time: 05/30/19  1:05 AM   Specimen: BLOOD  Result Value Ref Range Status   Specimen Description   Final    BLOOD LEFT ANTECUBITAL Performed at [place] [address] [place], [place] [address]    Special Requests   Final    BOTTLES DRAWN AEROBIC ONLY Blood Culture adequate volume Performed at [place] [address] [place], [place] [address]    Culture   Final    NO GROWTH 3 DAYS Performed at [facility], [place] [address], [place],  [address]    Report Status PENDING   Incomplete  Culture, Urine     Status: Abnormal   Collection Time: 05/30/19  8:31 AM   Specimen: Urine, Clean Catch  Result Value Ref Range Status   Specimen Description   Final    URINE, CLEAN CATCH Performed at [facility], [place] [address], [place],  [address]    Special Requests   Final    NONE Performed at [facility], [place] [address], [place], [place] [address]    Culture 10,000 COLONIES/mL ESCHERICHIA COLI (A)  Final   Report Status 06/01/2019 FINAL  Final   Organism ID, Bacteria ESCHERICHIA COLI (A)  Final      Susceptibility   Escherichia coli - MIC*    AMPICILLIN >=32 RESISTANT Resistant     CEFAZOLIN 16 SENSITIVE Sensitive     CEFTRIAXONE <=1 SENSITIVE Sensitive     CIPROFLOXACIN <=0.25 SENSITIVE Sensitive     GENTAMICIN <=1 SENSITIVE Sensitive     IMIPENEM <=0.25 SENSITIVE Sensitive     NITROFURANTOIN <=16 SENSITIVE Sensitive  TRIMETH/SULFA <=20 SENSITIVE Sensitive     AMPICILLIN/SULBACTAM >=32 RESISTANT Resistant     PIP/TAZO <=4 SENSITIVE Sensitive     * 10,000 COLONIES/mL ESCHERICHIA COLI     Labs: Basic Metabolic Panel: Recent Labs  Lab 05/28/19 0537 05/29/19 0500 05/30/19 0105 05/31/19 0403 06/01/19 0833 06/02/19 0352  NA 137 136 131* 134* 135 136  K 3.6 3.5 3.7 3.9 3.7 3.6  CL 104 105 102 104 102 103  CO2 23 21* 20* 21* 22 22  GLUCOSE 112* 101* 124* 109* 116* 109*  BUN 37* 31* 32* 39* 41* 40*  CREATININE 1.41* 0.98 1.07 1.17 1.19 0.93  CALCIUM 8.4* 8.4* 8.0* 8.5* 9.2 9.3  MG 2.3 2.1  --  2.0  --   --   PHOS 3.2 2.6 2.7 3.3  --   --    Liver Function Tests: Recent Labs  Lab 05/28/19 0537 05/29/19 0500 05/30/19 0105 05/31/19 0403 06/01/19 0833  AST 40 50* 59* 50* 47*  ALT 31 32 36 32 27  ALKPHOS 236* 272* 244* 236* 217*  BILITOT 1.5* 2.1* 2.0* 1.7* 1.6*  PROT 4.8* 4.6* 4.5* 4.8* 4.9*  ALBUMIN 2.3* 2.3* 2.2* 2.2* 2.6*   No results for input(s): LIPASE, AMYLASE in the last 168  hours. Recent Labs  Lab 05/31/19 1130  AMMONIA 28   CBC: Recent Labs  Lab 05/29/19 0500 05/30/19 0105 05/31/19 0403 06/01/19 0833 06/02/19 0352  WBC 8.2 6.5 2.6* 1.9* 2.1*  NEUTROABS 7.5 6.0 2.2 1.6* 1.6*  HGB 11.9* 12.0* 11.9* 10.3* 11.0*  HCT 37.0* 37.7* 37.4* 32.0* 34.3*  MCV 90.7 92.0 90.3 91.4 90.5  PLT 138* 100* 96* 49* 42*   Cardiac Enzymes: No results for input(s): CKTOTAL, CKMB, CKMBINDEX, TROPONINI in the last 168 hours. BNP: BNP (last 3 results) No results for input(s): BNP in the last 8760 hours.  ProBNP (last 3 results) No results for input(s): PROBNP in the last 8760 hours.  CBG: No results for input(s): GLUCAP in the last 168 hours.     Signed:  Irine Seal MD.  [facility] 06/02/2019, 1:20 PM

## 2019-06-02 NOTE — TOC Transition Note (Signed)
Transition of Care [facility]) - CM/SW Discharge Note   Patient Details  Name: Darius Ellis MRN: [id number] Date of Birth: April 19, 1941  Transition of Care [facility]) CM/SW Contact:  Dessa Phi, RN Phone Number: 06/02/2019, 12:09 PM   Clinical Narrative:  Per Elvis Coil care dme has been delivered to home-[facility] to be called to transport home will fax d/c summary to [facility] care.     Final next level of care: Home w Hospice Care Barriers to Discharge: No Barriers Identified   Patient Goals and CMS Choice Patient states their goals for this hospitalization and ongoing recovery are:: go home CMS Medicare.gov Compare Post Acute Care list provided to:: Patient Represenative (must comment)(dtr Vaughan Basta) Choice offered to / list presented to : Adult Children  Discharge Placement                Patient to be transferred to facility by: [place] Name of family member notified: linda dtr Patient and family notified of of transfer: 06/02/19  Discharge Plan and Services   Discharge Planning Services: CM Consult Post Acute Care Choice: Hospice                    HH Arranged: RN [facility] Agency: Hospice and [place] Date [place]: 06/01/19 Time [place]: 1121 Representative spoke with at [place]: [place] Determinants of Health ([place]) Interventions     Readmission Risk Interventions Readmission Risk Prevention Plan 05/29/2019  Transportation Screening Complete  PCP or Specialist Appt within 3-5 Days Not Complete  HRI or [place] Complete  Social Work Consult for [place] Planning/Counseling Complete  Palliative Care Screening Complete  Medication Review Press photographer) Complete

## 2019-06-02 NOTE — TOC Transition Note (Signed)
Transition of Care [facility]) - CM/SW Discharge Note   Patient Details  Name: Darius Ellis MRN: [id number] Date of Birth: 10/31/1940  Transition of [facility]) CM/SW Contact:  Dessa Phi, RN Phone Number: 06/02/2019, 1:45 PM   Clinical Narrative: d/c summary faxed w/confirmation to [facility] care.PTAR called-Nsg aware.      Final next level of care: Home w Hospice Care Barriers to Discharge: No Barriers Identified   Patient Goals and CMS Choice Patient states their goals for this hospitalization and ongoing recovery are:: go home CMS Medicare.gov Compare Post Acute Care list provided to:: Patient Represenative (must comment)(dtr Vaughan Basta) Choice offered to / list presented to : Adult Children  Discharge Placement                Patient to be transferred to facility by: [place] Name of family member notified: linda dtr Patient and family notified of of transfer: 06/02/19  Discharge Plan and Services   Discharge Planning Services: CM Consult Post Acute Care Choice: Hospice                    HH Arranged: RN [facility] Agency: Hospice and [place] Date [place]: 06/01/19 Time [place]: 1121 Representative spoke with at [place]: [place] Determinants of Health ([place]) Interventions     Readmission Risk Interventions Readmission Risk Prevention Plan 05/29/2019  Transportation Screening Complete  PCP or Specialist Appt within 3-5 Days Not Complete  HRI or [place] Complete  Social Work Consult for [place] Planning/Counseling Complete  Palliative Care Screening Complete  Medication Review Press photographer) Complete

## 2019-06-03 LAB — CULTURE, BODY FLUID W GRAM STAIN -BOTTLE
Culture: NO GROWTH
Special Requests: ADEQUATE

## 2019-06-04 LAB — CULTURE, BLOOD (ROUTINE X 2)
Culture: NO GROWTH
Culture: NO GROWTH
Special Requests: ADEQUATE
Special Requests: ADEQUATE

## 2019-06-06 ENCOUNTER — Other Ambulatory Visit: Payer: Self-pay | Admitting: Nurse Practitioner

## 2019-06-09 ENCOUNTER — Ambulatory Visit: Payer: Managed Care, Other (non HMO)

## 2019-06-09 ENCOUNTER — Other Ambulatory Visit: Payer: Managed Care, Other (non HMO)

## 2019-06-09 ENCOUNTER — Ambulatory Visit: Payer: Managed Care, Other (non HMO) | Admitting: Nurse Practitioner

## 2019-06-23 DEATH — deceased

## 2019-06-26 ENCOUNTER — Ambulatory Visit: Payer: Managed Care, Other (non HMO) | Admitting: Nurse Practitioner

## 2019-06-26 ENCOUNTER — Other Ambulatory Visit: Payer: Managed Care, Other (non HMO)

## 2019-06-26 ENCOUNTER — Ambulatory Visit: Payer: Managed Care, Other (non HMO)

## 2019-06-27 LAB — FUNGUS CULTURE WITH STAIN

## 2019-06-27 LAB — FUNGAL ORGANISM REFLEX

## 2019-06-27 LAB — FUNGUS CULTURE RESULT

## 2019-08-24 ENCOUNTER — Telehealth: Payer: Self-pay | Admitting: *Deleted

## 2019-08-24 NOTE — Telephone Encounter (Signed)
Called daughter to f/u on decision to continue chemo. She reports he was discharged home w/Hospice in 07-07-2023 and died few days later.

## 2019-11-15 ENCOUNTER — Telehealth: Payer: Self-pay | Admitting: Oncology

## 2019-11-15 NOTE — Telephone Encounter (Signed)
Faxed medical records to [facility] at [phone], Release ID: [id number]

## 2020-08-28 IMAGING — US US PARACENTESIS
1 series · 5 of 5 positions shown · non-contrast
Comparison: none

INDICATION: Patient with history of metastatic adenocarcinoma of unknown primary
with ascites. Request was made for therapeutic and diagnostic
paracentesis up to 5 L maximum

[Series 1: us paracentesis · 5 of 5 slices shown]
[im 1/5]
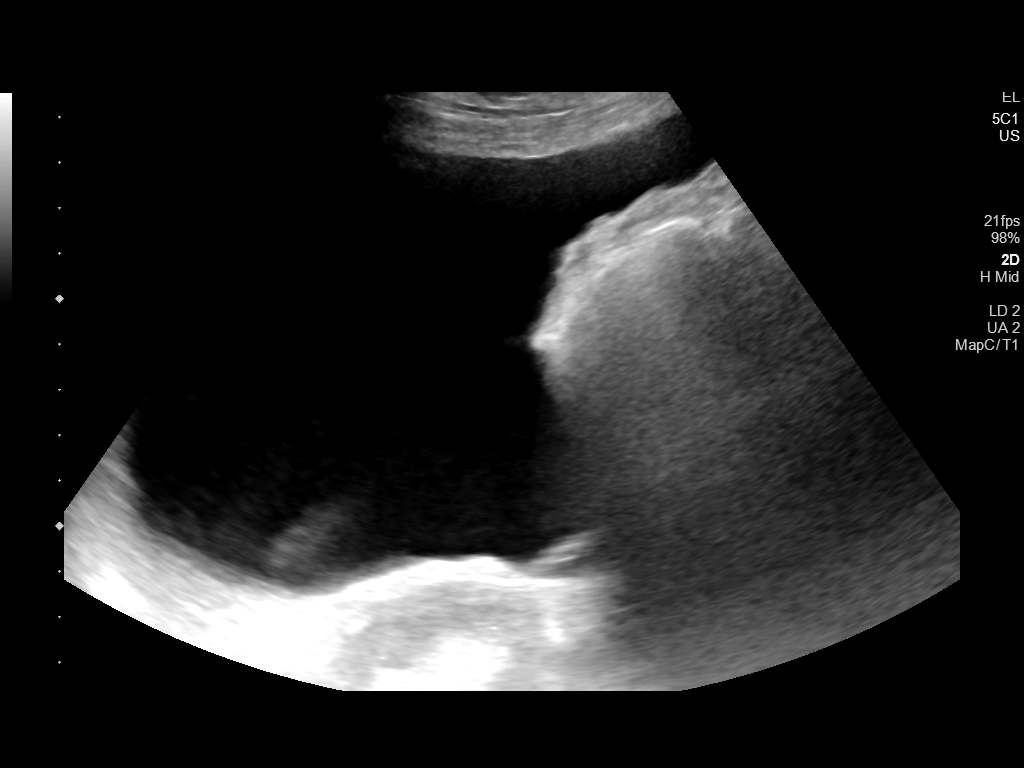
[im 2/5]
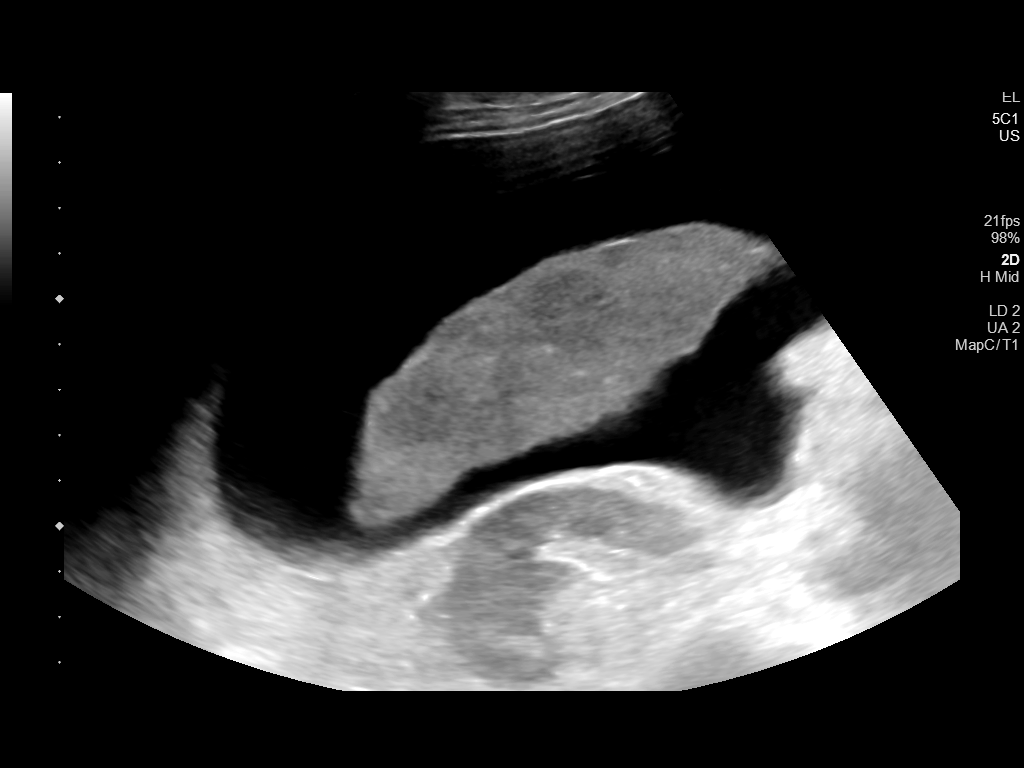
[im 3/5]
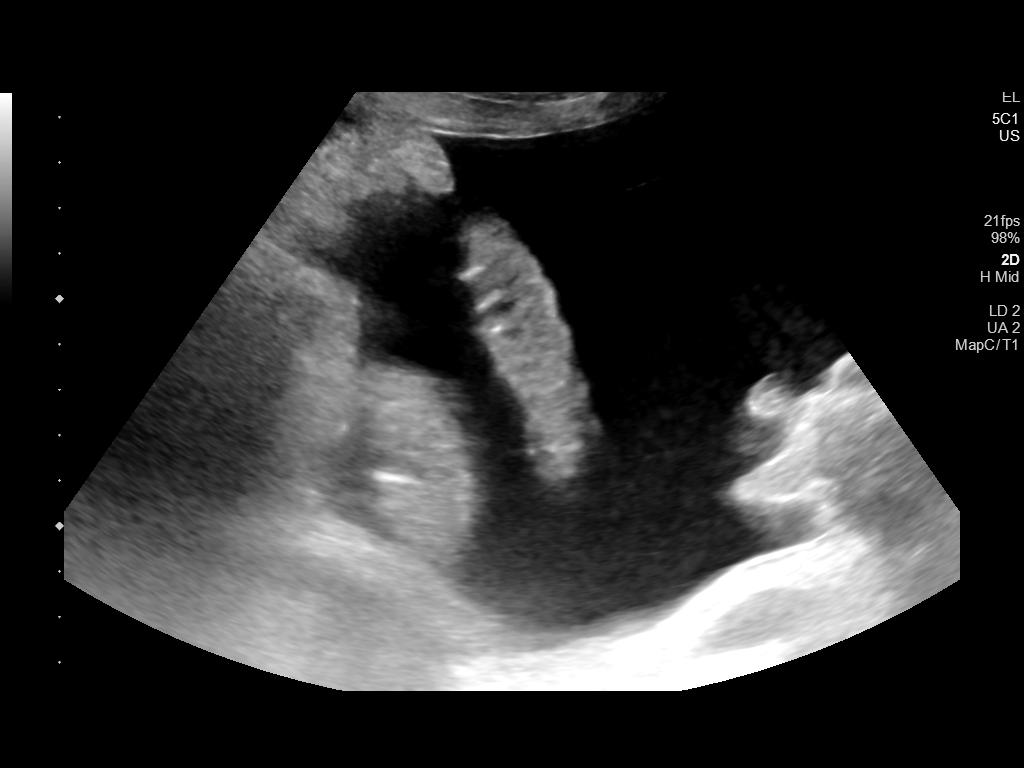
[im 4/5]
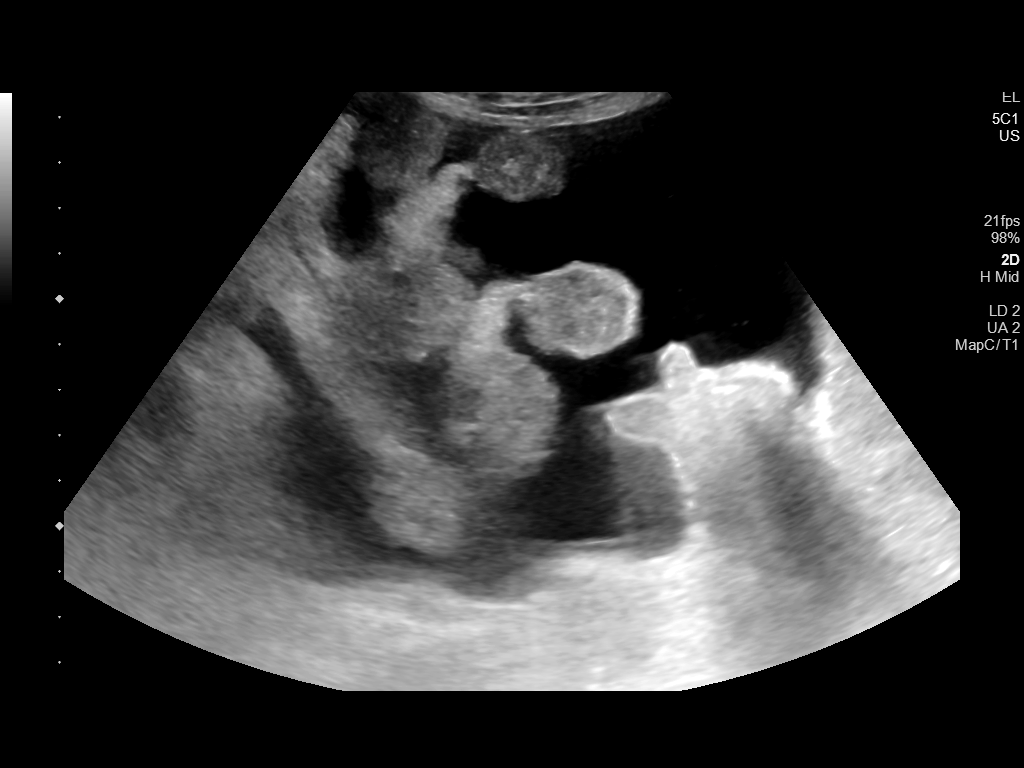
[im 5/5]
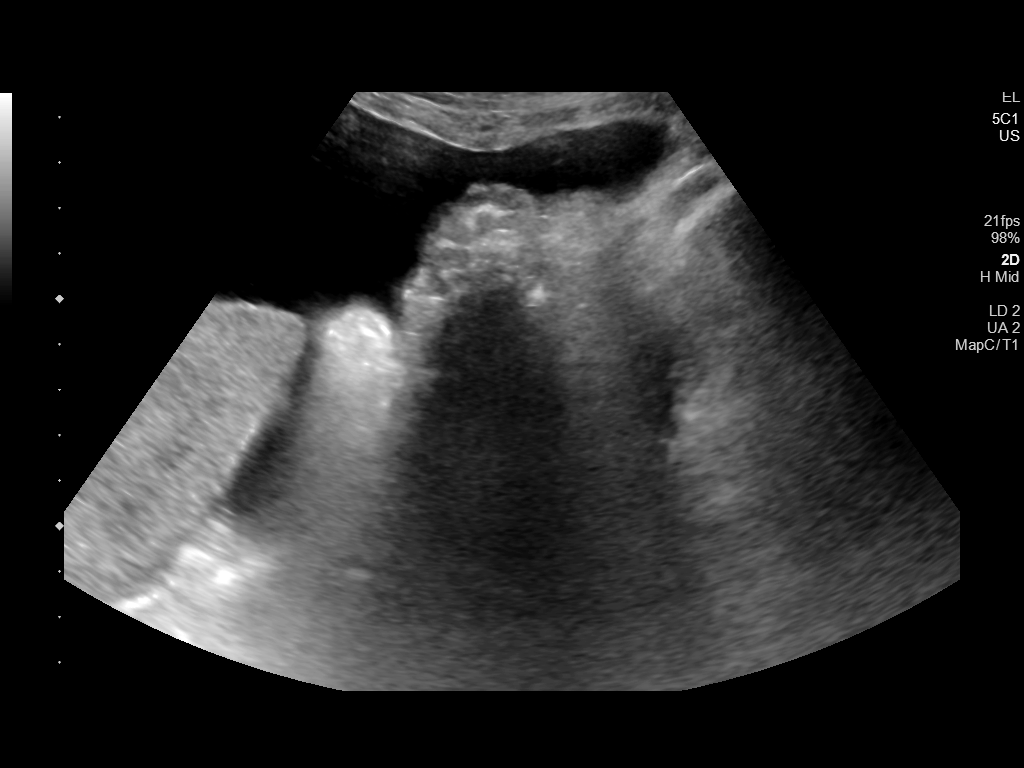

[5 of 5 positions shown; findings below may reference images not displayed]

EXAM:
ULTRASOUND GUIDED THERAPEUTIC AND DIAGNOSTIC PARACENTESIS

MEDICATIONS:
None.

COMPLICATIONS:
None immediate.

PROCEDURE:
Informed written consent was obtained from the patient after a
discussion of the risks, benefits and alternatives to treatment. A
timeout was performed prior to the initiation of the procedure.

Initial ultrasound scanning demonstrates a large amount of ascites
within the right lower abdominal quadrant. The right lower abdomen
was prepped and draped in the usual sterile fashion. 1% lidocaine
was used for local anesthesia.

Following this, a 19 gauge, 7-cm, Yueh catheter was introduced. An
ultrasound image was saved for documentation purposes. The
paracentesis was performed. The catheter was removed and a dressing
was applied. The patient tolerated the procedure well without
immediate post procedural complication.
FINDINGS: A total of approximately 5 L of straw colored fluid was removed.
Samples were sent to the laboratory as requested by the clinical
team.
IMPRESSION: Successful ultrasound-guided therapeutic and diagnostic paracentesis
yielding 5 liters of peritoneal fluid.

Read by Morweetsana Moscow NP

## 2020-09-02 IMAGING — CT CT ANGIO CHEST
1 of 4 series · 12 of 32 positions shown · IV contrast (omnipaque)
Comparison: May 02, 2019

CLINICAL DATA: Dizziness and syncope

EXAM:
CT ANGIOGRAPHY CHEST WITH CONTRAST
TECHNIQUE: Multidetector CT imaging of the chest was performed using the
standard protocol during bolus administration of intravenous
contrast. Multiplanar CT image reconstructions and MIPs were
obtained to evaluate the vascular anatomy.
CONTRAST:  100mL OMNIPAQUE IOHEXOL 350 MG/ML SOLN

[Series 4: axial st · axial · 0.68mm/px · z∈[-238,-42]mm · 12 of 116 slices shown]
[im 9/116  lung]
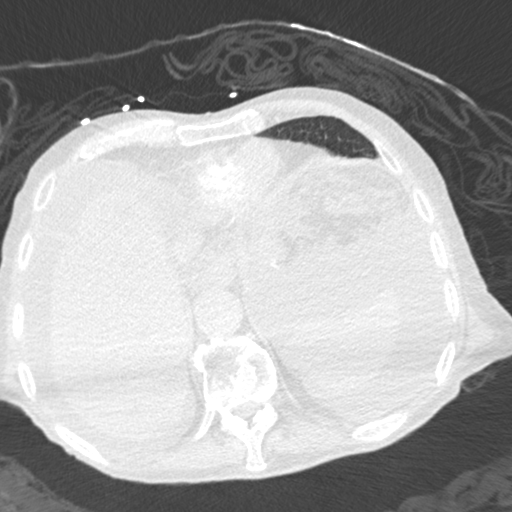
[im 18/116  mediastinal]
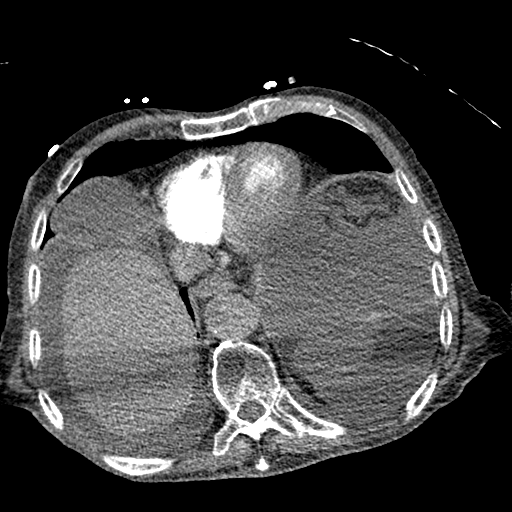
[im 27/116  lung]
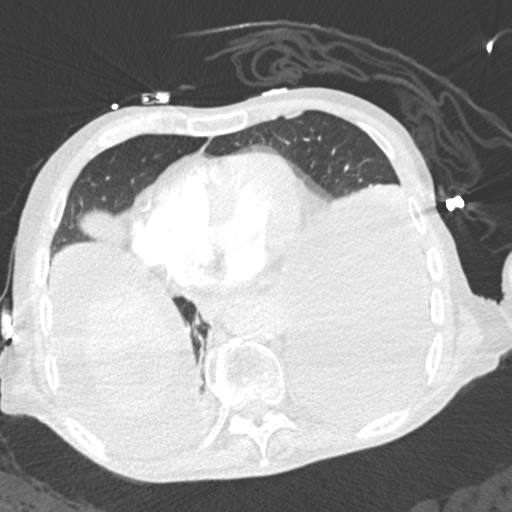
[im 36/116  mediastinal]
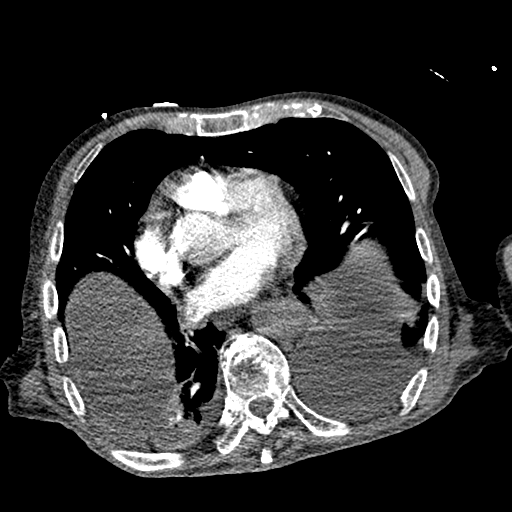
[im 45/116  lung]
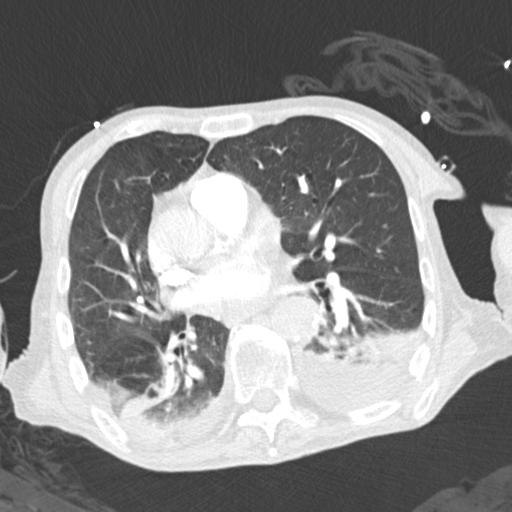
[im 54/116  mediastinal]
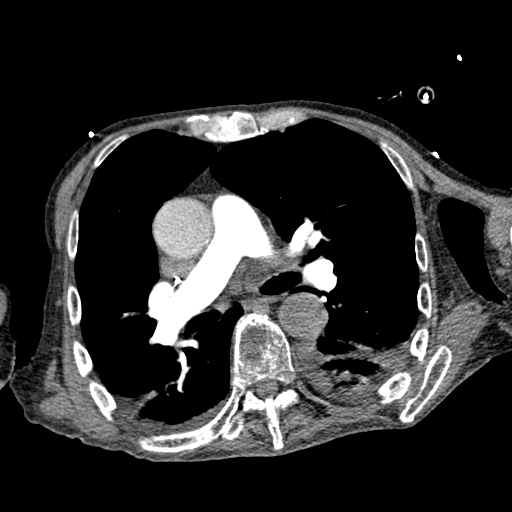
[im 62/116  lung]
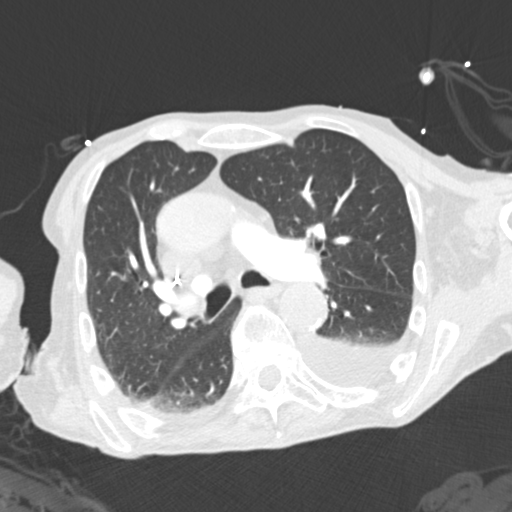
[im 71/116  mediastinal]
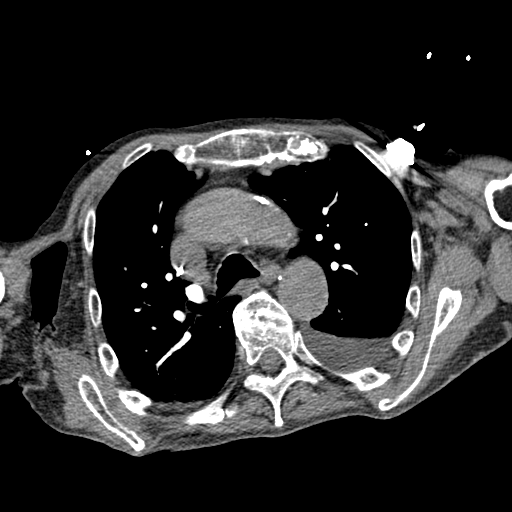
[im 80/116  lung]
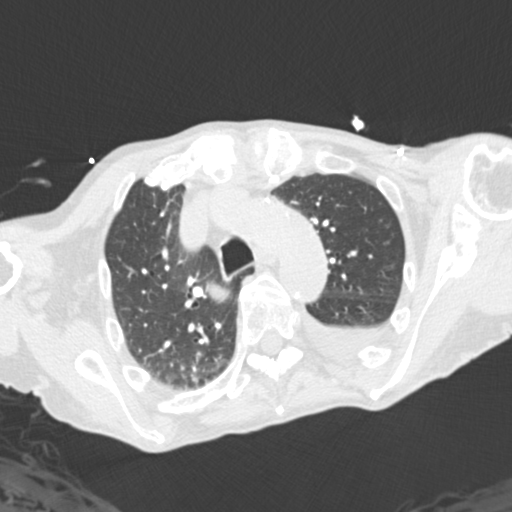
[im 89/116  mediastinal]
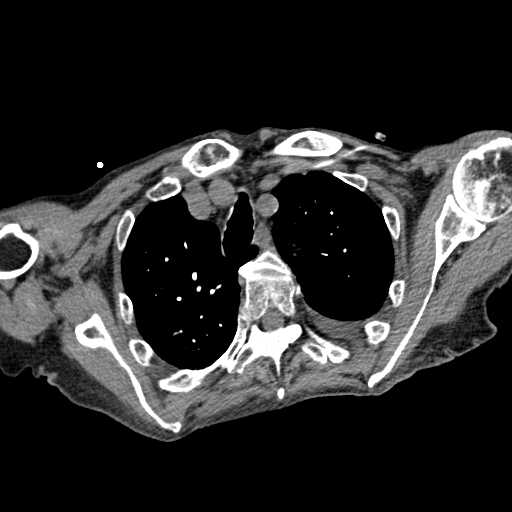
[im 98/116  lung]
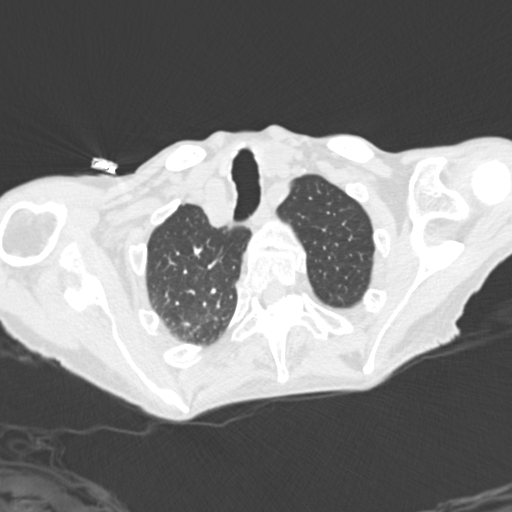
[im 107/116  mediastinal]
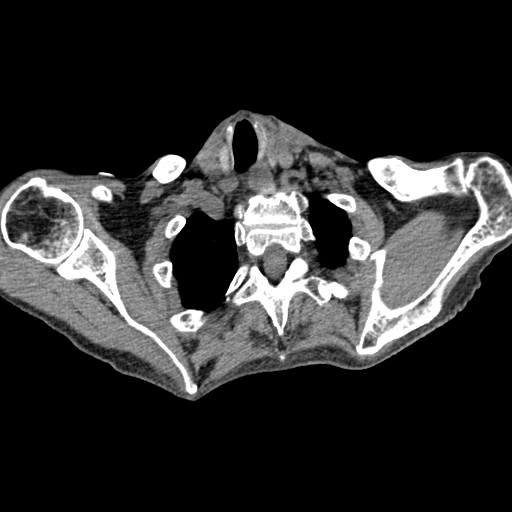

[12 of 32 positions shown; findings below may reference images not displayed]

FINDINGS: Cardiovascular: There is a optimal opacification of the pulmonary
arteries. There is no central,segmental, or subsegmental filling
defects within the pulmonary arteries. The heart is normal in size.
No pericardial effusion or thickening. No evidence right heart
strain. There is normal three-vessel brachiocephalic anatomy without
proximal stenosis. Scattered aortic atherosclerotic calcifications
are seen without aneurysmal dilatation.

Mediastinum/Nodes: No hilar, mediastinal, or axillary adenopathy.
Again noted is a 1.4 cm low-density lesion within the left thyroid
lobe.

Lungs/Pleura: There are small bilateral pleural effusions, left
greater right. Patchy/streaky airspace opacity seen at both lung
bases which could be compressive atelectasis.

Upper Abdomen: There is partially visualized multiple hypodense
lesions seen throughout the liver. A moderate amount of abdominal
ascites is seen. For

Musculoskeletal: No chest wall abnormality. No acute or significant
osseous findings. Anterior flowing osteophytes seen throughout the
thoracic spine.

Review of the MIP images confirms the above findings.
IMPRESSION: 1. no central, segmental, or subsegmental pulmonary embolism.
2. Small bilateral pleural effusions, left greater than right with
probable adjacent compressive atelectasis.
3. Partially visualized multiple hypodense liver lesions.
4. Moderate abdominal ascites.

## 2020-09-02 IMAGING — DX DG CHEST 1V PORT
1 series · 1 of 1 positions shown · non-contrast
Comparison: None.

CLINICAL DATA: Dizziness and syncope

EXAM:
PORTABLE CHEST 1 VIEW

[chest ap]
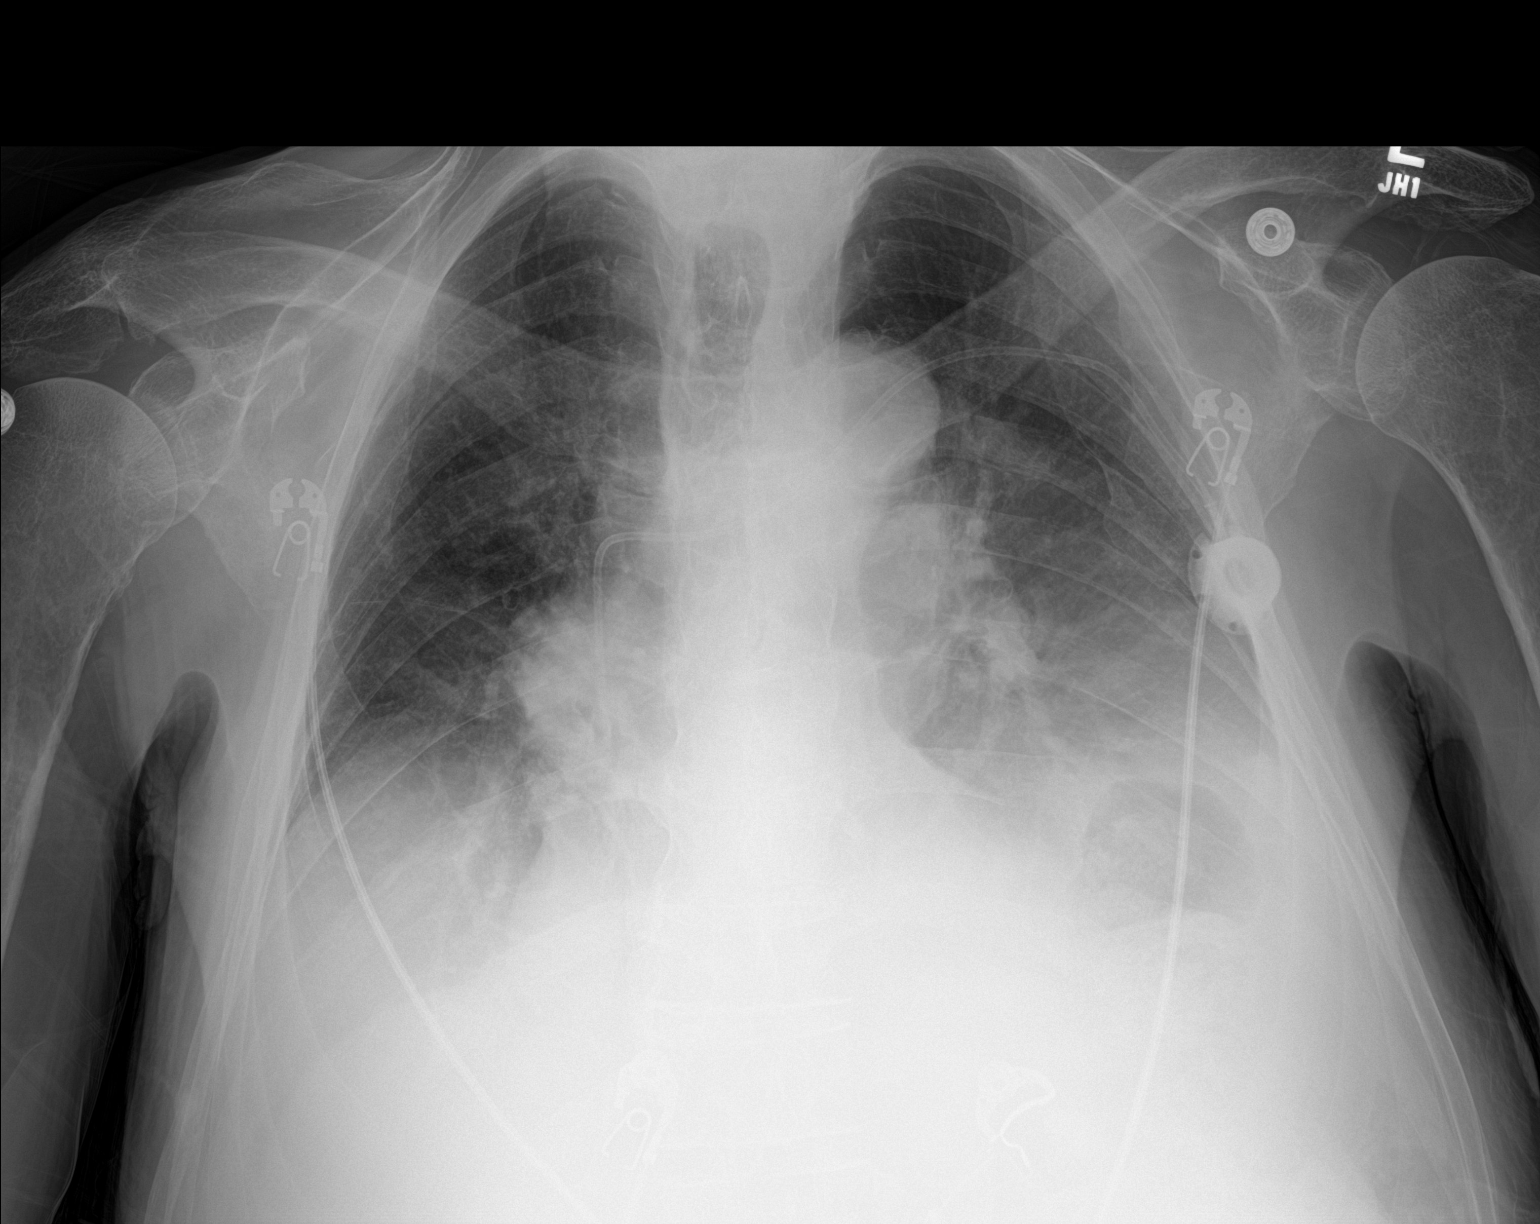

[1 of 1 positions shown; findings below may reference images not displayed]

FINDINGS: There is mild cardiomegaly. A left-sided MediPort catheter is seen
with the tip at the lower SVC. There is moderate left and small
right pleural effusion. Hazy airspace opacity seen adjacent to lower
lungs which is likely layering effusion and/or compressive
atelectasis. No acute osseous abnormality.
IMPRESSION: Moderate left and small right pleural effusion with probable
adjacent layering effusion and/or atelectasis.

## 2020-09-03 IMAGING — US US PARACENTESIS
1 series · 6 of 6 positions shown · non-contrast
Comparison: none

INDICATION: Patient with history of metastatic adenocarcinoma of unknown
primary, left lower extremity DVT, recurrent ascites; request
received for diagnostic and therapeutic paracentesis.

[Series 1: us paracentesis · 6 of 6 slices shown]
[im 1/6]
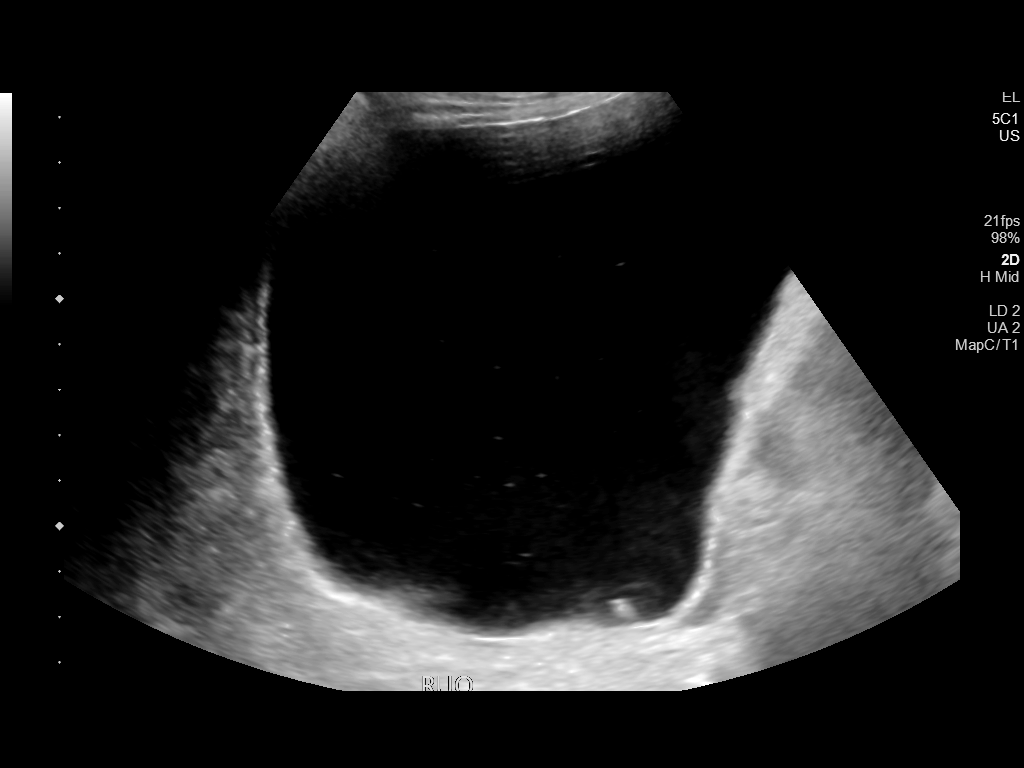
[im 2/6]
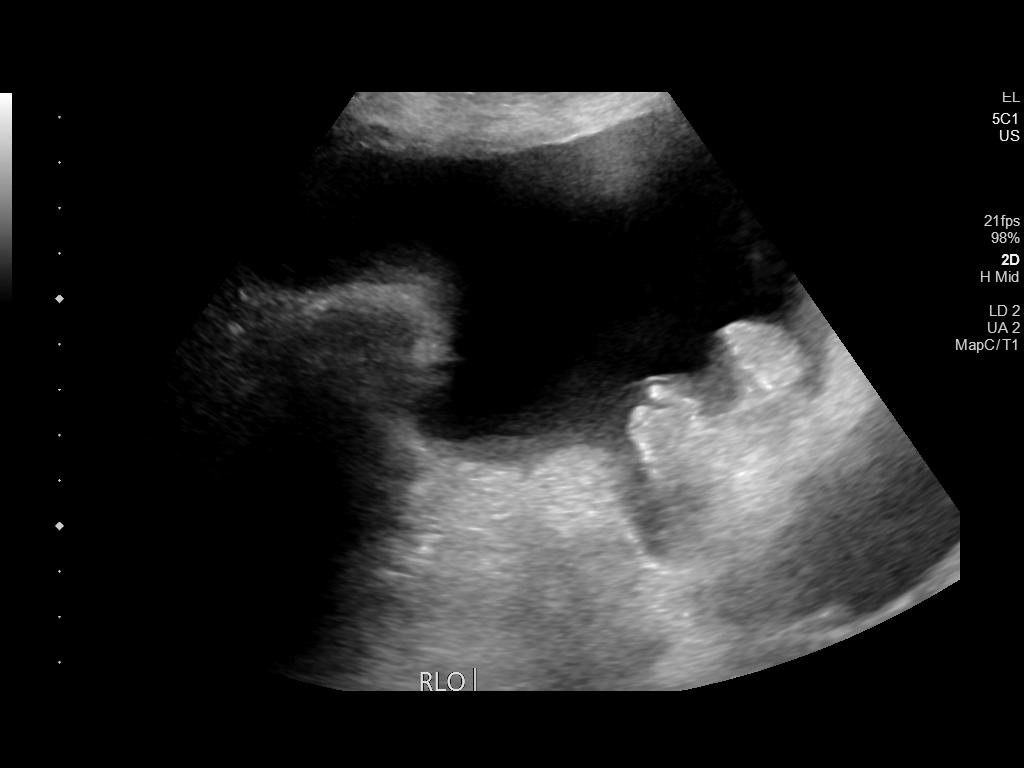
[im 3/6]
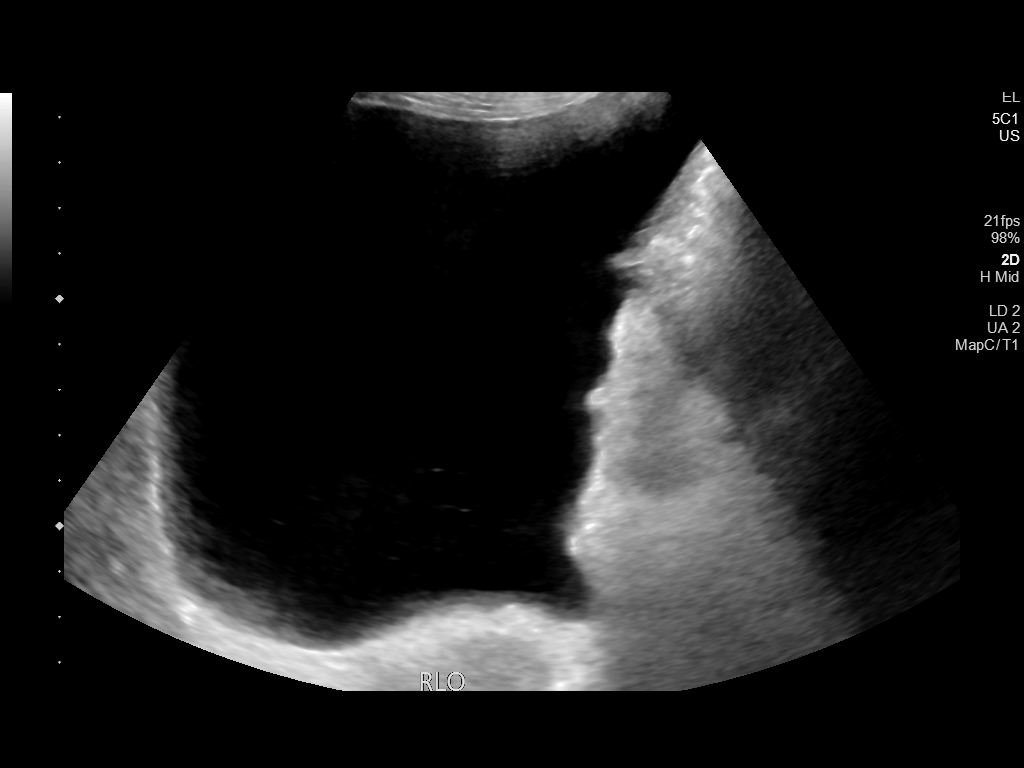
[im 4/6]
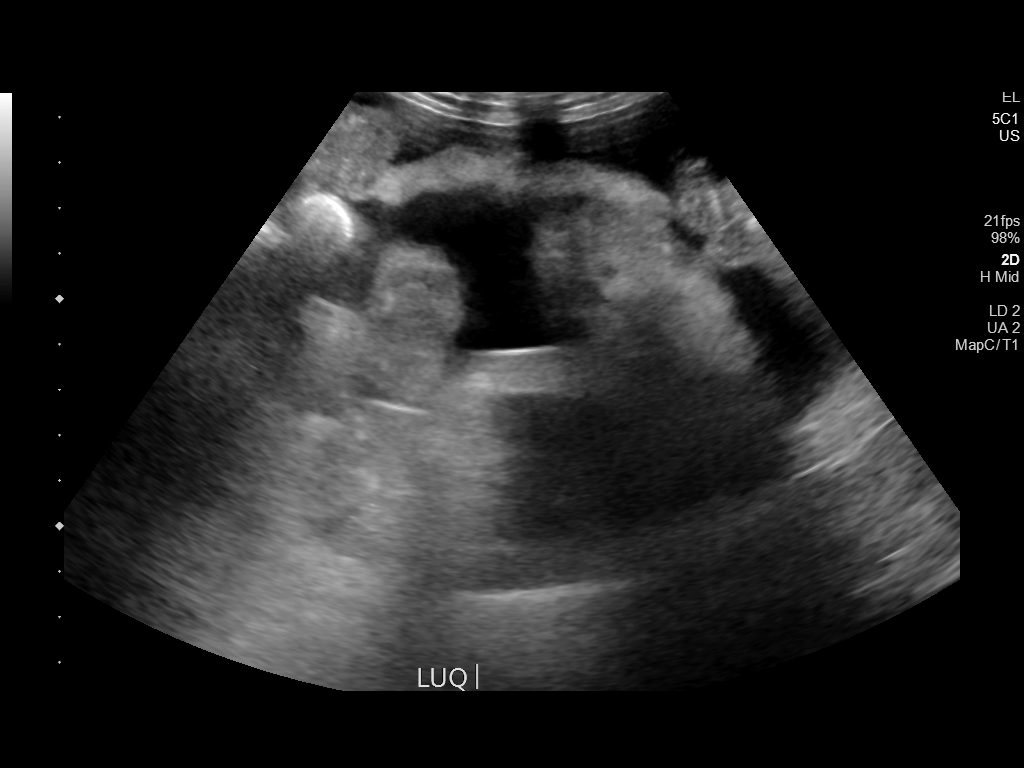
[im 5/6]
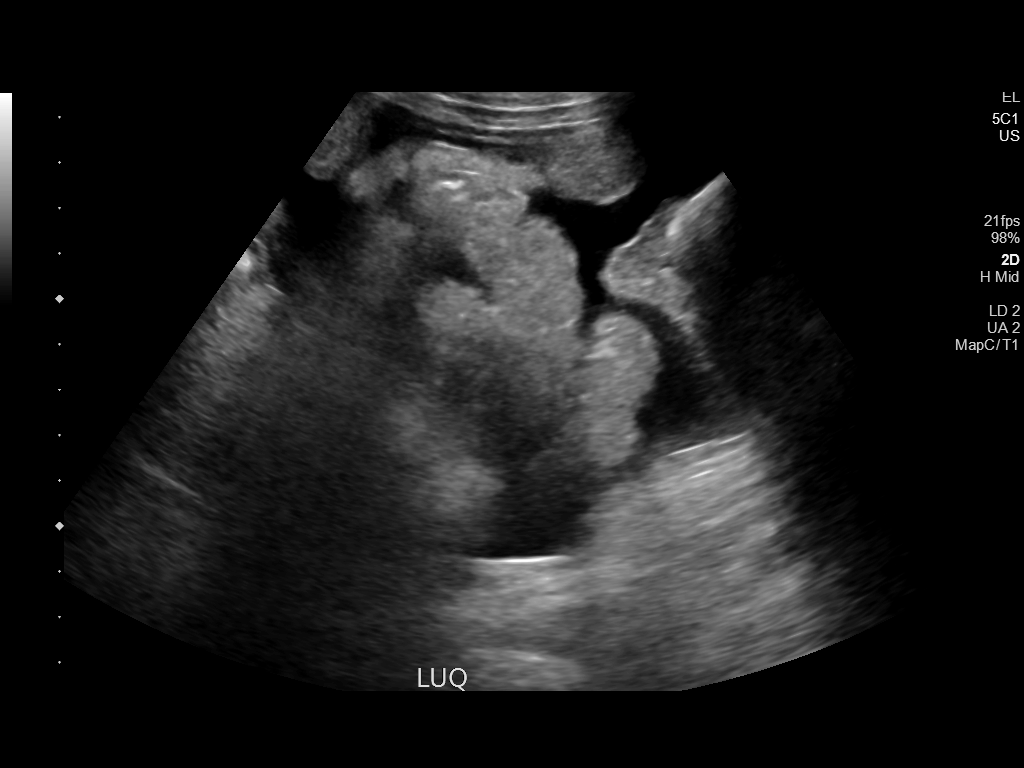
[im 6/6]
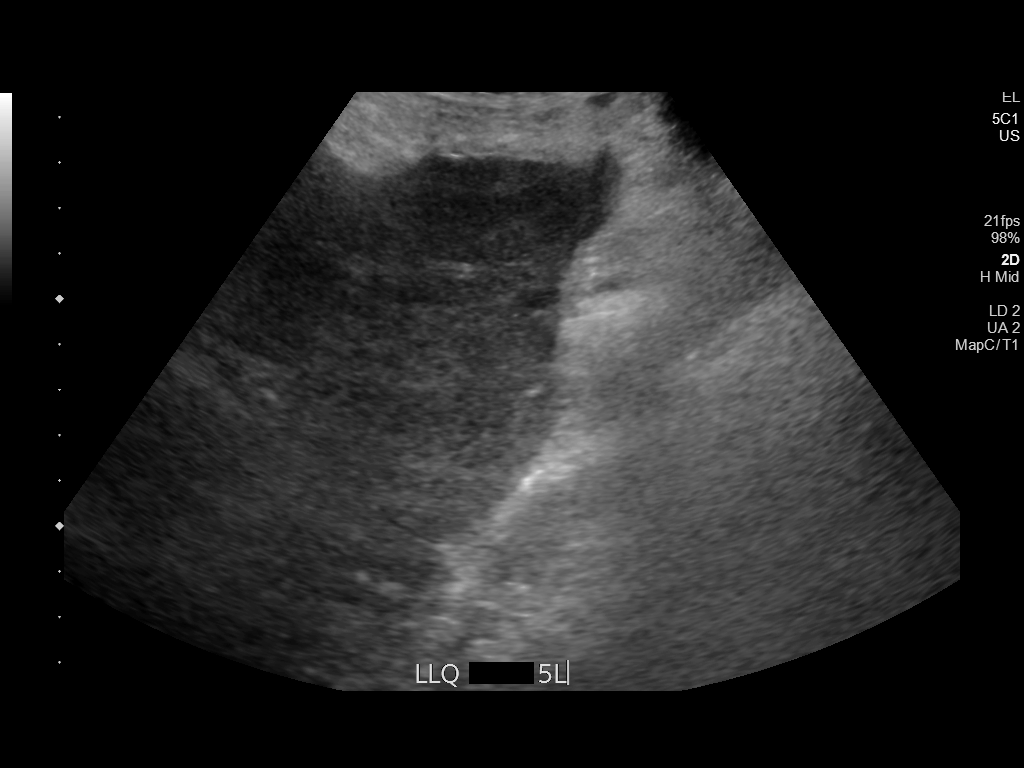

[6 of 6 positions shown; findings below may reference images not displayed]

EXAM:
ULTRASOUND GUIDED DIAGNOSTIC AND THERAPEUTIC PARACENTESIS

MEDICATIONS:
None

COMPLICATIONS:
None immediate.

PROCEDURE:
Informed written consent was obtained from the patient after a
discussion of the risks, benefits and alternatives to treatment. A
timeout was performed prior to the initiation of the procedure.

Initial ultrasound scanning demonstrates a large amount of ascites
within the right mid to lower abdominal quadrant. The right mid to
lower abdomen was prepped and draped in the usual sterile fashion.
1% lidocaine was used for local anesthesia.

Following this, a 19 gauge, 10-cm, Yueh catheter was introduced. An
ultrasound image was saved for documentation purposes. The
paracentesis was performed. The catheter was removed and a dressing
was applied. The patient tolerated the procedure well without
immediate post procedural complication.
Patient was administered 25 grams IV albumin by floor nurse on unit
FINDINGS: A total of approximately 5 liters of slightly hazy, yellow fluid was
removed. Samples were sent to the laboratory as requested by the
clinical team.
IMPRESSION: Successful ultrasound-guided diagnostic and therapeutic paracentesis
yielding 5 liters of peritoneal fluid.
